# Patient Record
Sex: Female | Born: 1955 | Race: White | Hispanic: No | Marital: Married | Smoking: Current every day smoker
Health system: Southern US, Community
[De-identification: ages and names within clinical notes are randomized; demographics above are authoritative.]

## PROBLEM LIST (undated history)

## (undated) DIAGNOSIS — C159 Malignant neoplasm of esophagus, unspecified: Secondary | ICD-10-CM

## (undated) DIAGNOSIS — E785 Hyperlipidemia, unspecified: Secondary | ICD-10-CM

## (undated) DIAGNOSIS — R531 Weakness: Secondary | ICD-10-CM

## (undated) DIAGNOSIS — E039 Hypothyroidism, unspecified: Secondary | ICD-10-CM

## (undated) DIAGNOSIS — Z720 Tobacco use: Secondary | ICD-10-CM

## (undated) HISTORY — DX: Hyperlipidemia, unspecified: E78.5

## (undated) HISTORY — PX: MANDIBLE SURGERY: SHX707

## (undated) HISTORY — DX: Weakness: R53.1

## (undated) HISTORY — DX: Malignant neoplasm of esophagus, unspecified: C15.9

## (undated) HISTORY — PX: DILATION AND CURETTAGE OF UTERUS: SHX78

---

## 1984-12-10 HISTORY — PX: CHOLECYSTECTOMY: SHX55

## 1998-12-10 HISTORY — PX: ABDOMINAL HYSTERECTOMY: SHX81

## 2001-01-23 ENCOUNTER — Other Ambulatory Visit: Admission: RE | Admit: 2001-01-23 | Discharge: 2001-01-23 | Payer: Self-pay | Admitting: *Deleted

## 2001-02-07 ENCOUNTER — Encounter (INDEPENDENT_AMBULATORY_CARE_PROVIDER_SITE_OTHER): Payer: Self-pay | Admitting: Specialist

## 2001-02-07 ENCOUNTER — Other Ambulatory Visit: Admission: RE | Admit: 2001-02-07 | Discharge: 2001-02-07 | Payer: Self-pay | Admitting: *Deleted

## 2002-06-02 ENCOUNTER — Encounter: Payer: Self-pay | Admitting: Family Medicine

## 2002-06-02 ENCOUNTER — Ambulatory Visit (HOSPITAL_COMMUNITY): Admission: RE | Admit: 2002-06-02 | Discharge: 2002-06-02 | Payer: Self-pay | Admitting: Family Medicine

## 2002-07-14 ENCOUNTER — Other Ambulatory Visit: Admission: RE | Admit: 2002-07-14 | Discharge: 2002-07-14 | Payer: Self-pay | Admitting: *Deleted

## 2002-07-16 ENCOUNTER — Encounter: Payer: Self-pay | Admitting: *Deleted

## 2002-07-16 ENCOUNTER — Ambulatory Visit (HOSPITAL_COMMUNITY): Admission: RE | Admit: 2002-07-16 | Discharge: 2002-07-16 | Payer: Self-pay | Admitting: *Deleted

## 2002-07-29 ENCOUNTER — Encounter: Payer: Self-pay | Admitting: *Deleted

## 2002-07-29 ENCOUNTER — Ambulatory Visit (HOSPITAL_COMMUNITY): Admission: RE | Admit: 2002-07-29 | Discharge: 2002-07-29 | Payer: Self-pay | Admitting: *Deleted

## 2003-01-29 ENCOUNTER — Ambulatory Visit (HOSPITAL_COMMUNITY): Admission: RE | Admit: 2003-01-29 | Discharge: 2003-01-29 | Payer: Self-pay | Admitting: *Deleted

## 2003-01-29 ENCOUNTER — Encounter: Payer: Self-pay | Admitting: *Deleted

## 2003-07-12 ENCOUNTER — Other Ambulatory Visit: Admission: RE | Admit: 2003-07-12 | Discharge: 2003-07-12 | Payer: Self-pay | Admitting: *Deleted

## 2003-08-03 ENCOUNTER — Ambulatory Visit (HOSPITAL_COMMUNITY): Admission: RE | Admit: 2003-08-03 | Discharge: 2003-08-03 | Payer: Self-pay | Admitting: *Deleted

## 2003-08-03 ENCOUNTER — Encounter: Payer: Self-pay | Admitting: *Deleted

## 2004-02-09 ENCOUNTER — Ambulatory Visit (HOSPITAL_COMMUNITY): Admission: RE | Admit: 2004-02-09 | Discharge: 2004-02-09 | Payer: Self-pay | Admitting: *Deleted

## 2004-07-31 ENCOUNTER — Other Ambulatory Visit: Admission: RE | Admit: 2004-07-31 | Discharge: 2004-07-31 | Payer: Self-pay | Admitting: *Deleted

## 2005-08-22 ENCOUNTER — Other Ambulatory Visit: Admission: RE | Admit: 2005-08-22 | Discharge: 2005-08-22 | Payer: Self-pay | Admitting: *Deleted

## 2005-09-12 ENCOUNTER — Ambulatory Visit (HOSPITAL_COMMUNITY): Admission: RE | Admit: 2005-09-12 | Discharge: 2005-09-12 | Payer: Self-pay | Admitting: *Deleted

## 2006-10-16 ENCOUNTER — Other Ambulatory Visit: Admission: RE | Admit: 2006-10-16 | Discharge: 2006-10-16 | Payer: Self-pay | Admitting: *Deleted

## 2006-10-18 ENCOUNTER — Ambulatory Visit (HOSPITAL_COMMUNITY): Admission: RE | Admit: 2006-10-18 | Discharge: 2006-10-18 | Payer: Self-pay | Admitting: *Deleted

## 2007-10-21 ENCOUNTER — Other Ambulatory Visit: Admission: RE | Admit: 2007-10-21 | Discharge: 2007-10-21 | Payer: Self-pay | Admitting: *Deleted

## 2007-10-23 ENCOUNTER — Ambulatory Visit (HOSPITAL_COMMUNITY): Admission: RE | Admit: 2007-10-23 | Discharge: 2007-10-23 | Payer: Self-pay | Admitting: *Deleted

## 2007-11-05 ENCOUNTER — Ambulatory Visit (HOSPITAL_COMMUNITY): Admission: RE | Admit: 2007-11-05 | Discharge: 2007-11-05 | Payer: Self-pay | Admitting: *Deleted

## 2008-05-10 ENCOUNTER — Ambulatory Visit (HOSPITAL_COMMUNITY): Admission: RE | Admit: 2008-05-10 | Discharge: 2008-05-10 | Payer: Self-pay | Admitting: *Deleted

## 2008-06-22 ENCOUNTER — Ambulatory Visit (HOSPITAL_COMMUNITY): Admission: RE | Admit: 2008-06-22 | Discharge: 2008-06-22 | Payer: Self-pay | Admitting: Family Medicine

## 2008-06-23 ENCOUNTER — Ambulatory Visit (HOSPITAL_COMMUNITY): Admission: RE | Admit: 2008-06-23 | Discharge: 2008-06-23 | Payer: Self-pay | Admitting: Family Medicine

## 2008-06-25 ENCOUNTER — Observation Stay (HOSPITAL_COMMUNITY): Admission: RE | Admit: 2008-06-25 | Discharge: 2008-06-26 | Payer: Self-pay | Admitting: Family Medicine

## 2008-07-07 ENCOUNTER — Ambulatory Visit (HOSPITAL_COMMUNITY): Admission: RE | Admit: 2008-07-07 | Discharge: 2008-07-07 | Payer: Self-pay | Admitting: Family Medicine

## 2008-07-07 IMAGING — US US SOFT TISSUE HEAD/NECK
1 series · 14 of 25 positions shown · non-contrast
Comparison: Correlation made with CT chest exam [DATE]

CLINICAL DATA: Abnormal thyroid exam, thyroid abnormality on the CT
chest

THYROID ULTRASOUND
TECHNIQUE: Ultrasound examination of the thyroid gland and all
adjacent soft tissues was performed.

[Series 1: unknown · 0.09mm/px · 14 of 53 slices shown]
[im 1/53]
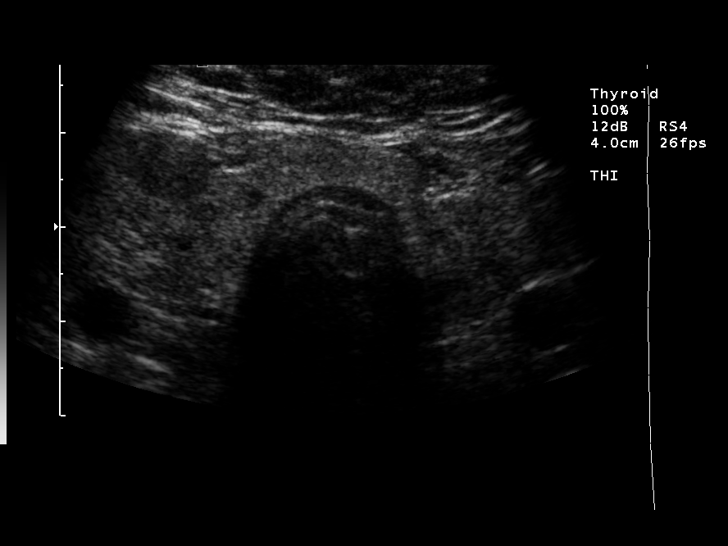
[im 5/53]
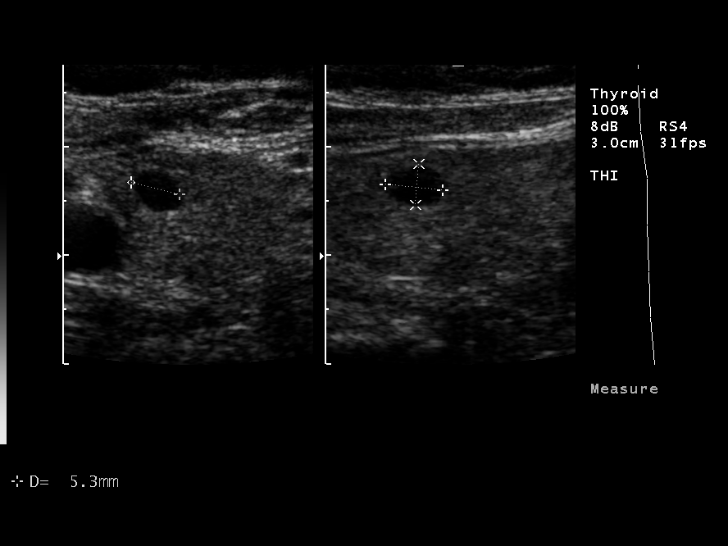
[im 9/53]
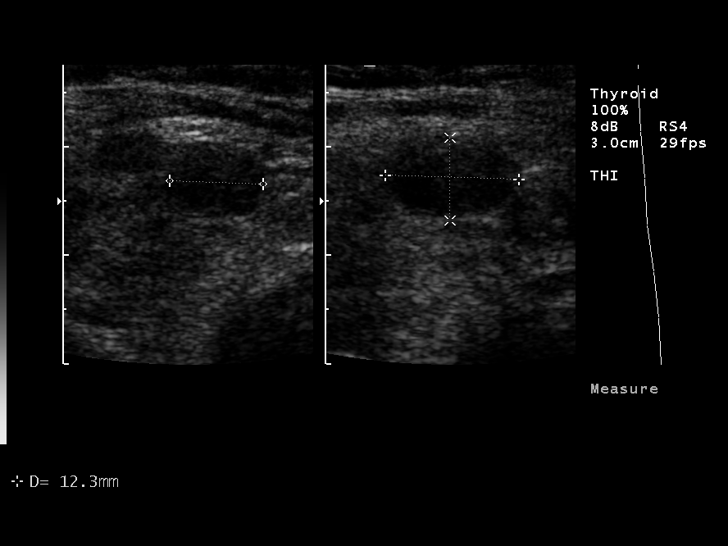
[im 14/53]
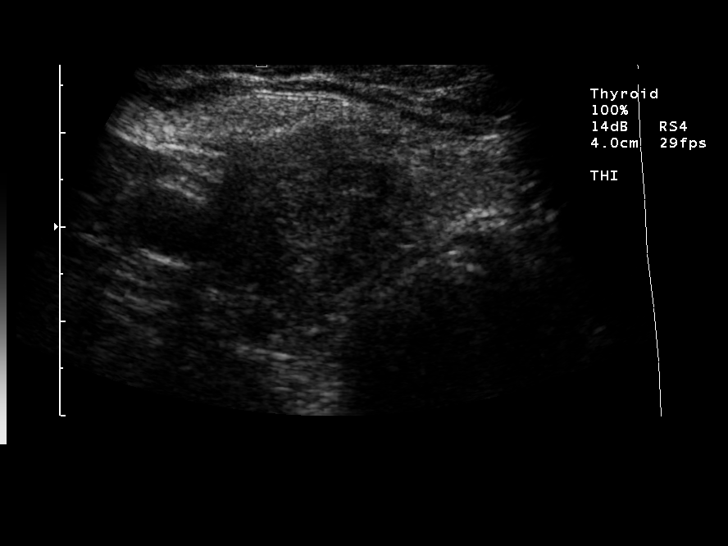
[im 18/53]
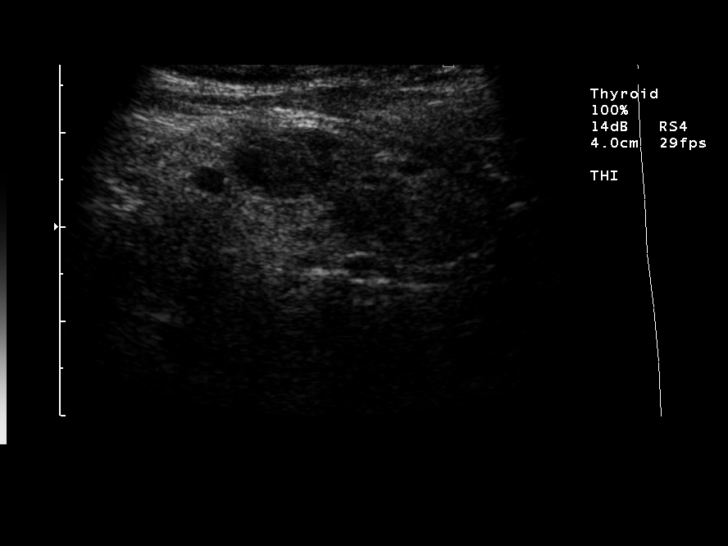
[im 20/53]
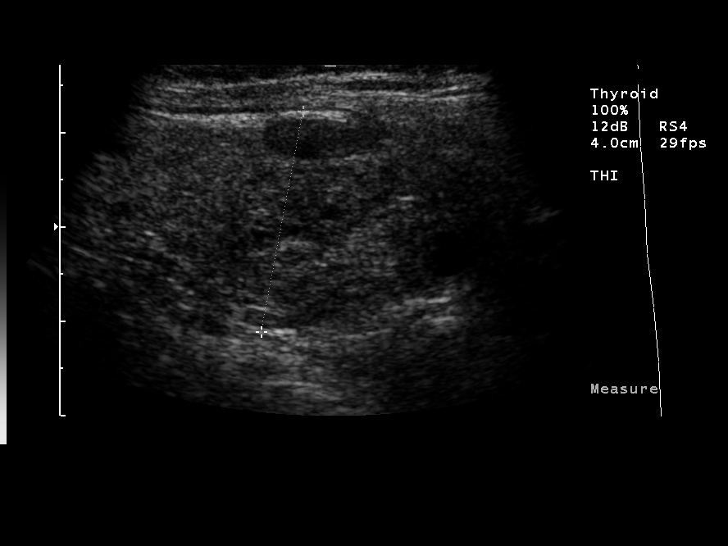
[im 24/53]
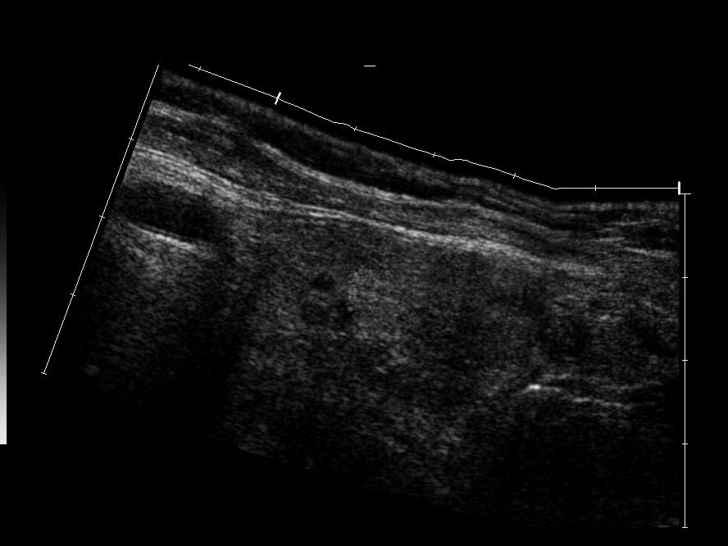
[im 29/53]
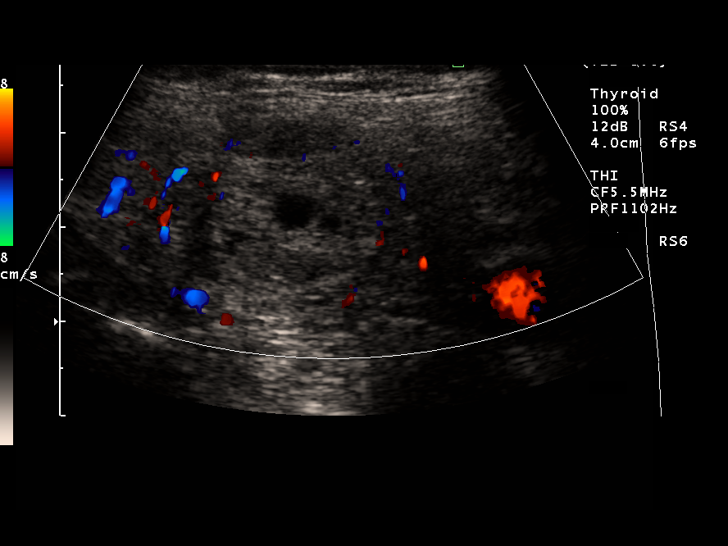
[im 33/53]
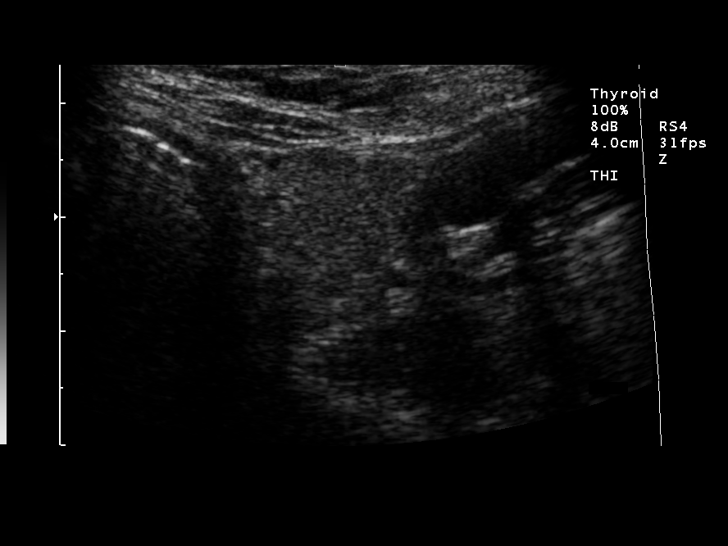
[im 35/53]
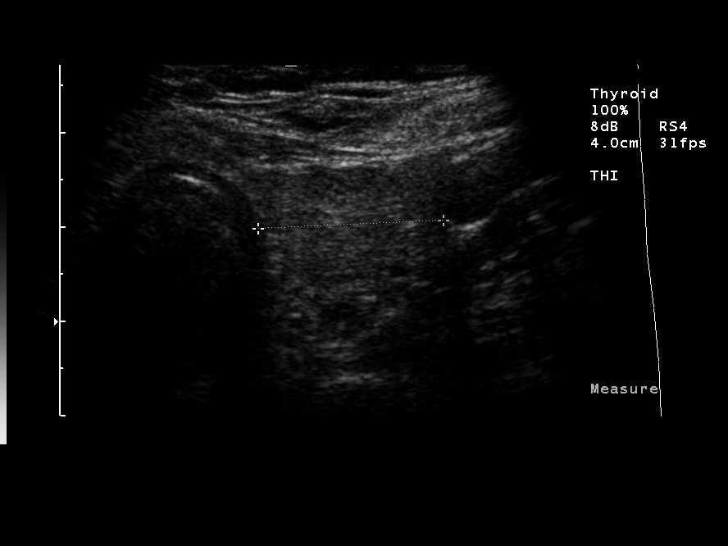
[im 40/53]
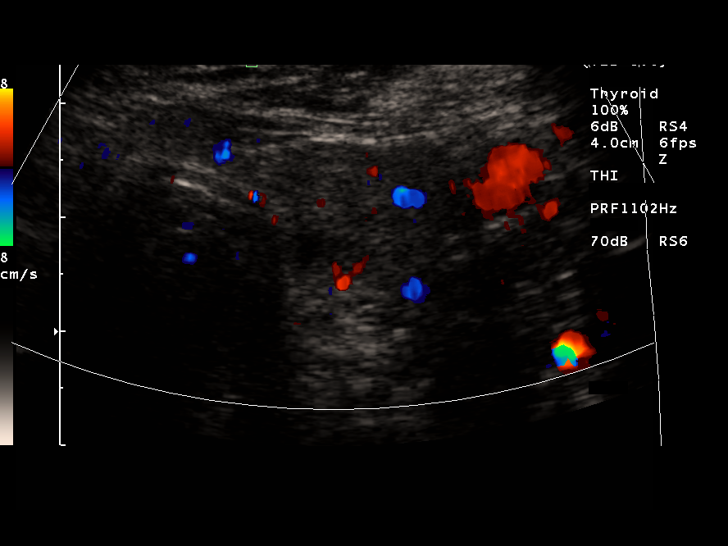
[im 44/53]
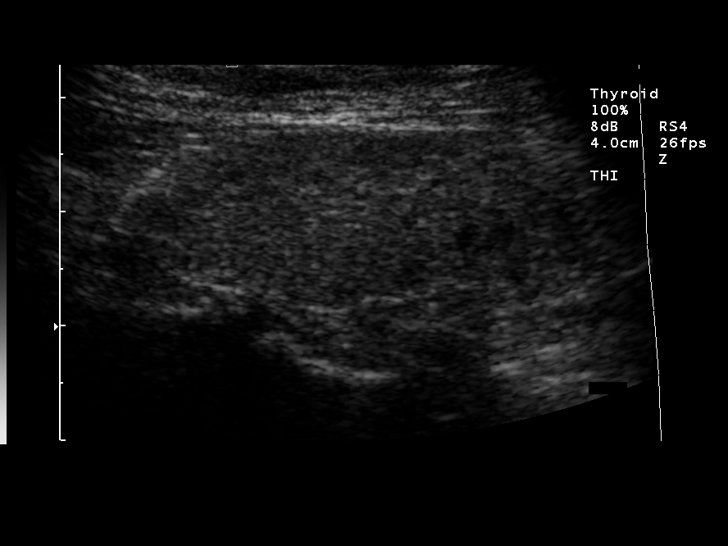
[im 48/53]
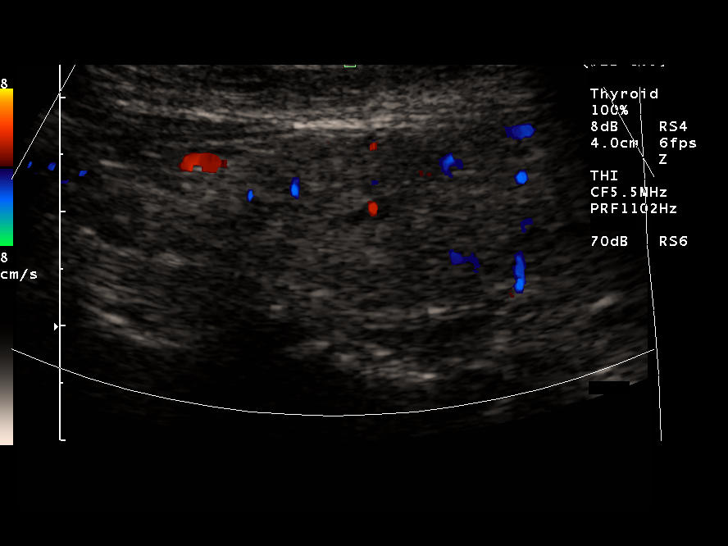
[im 53/53]
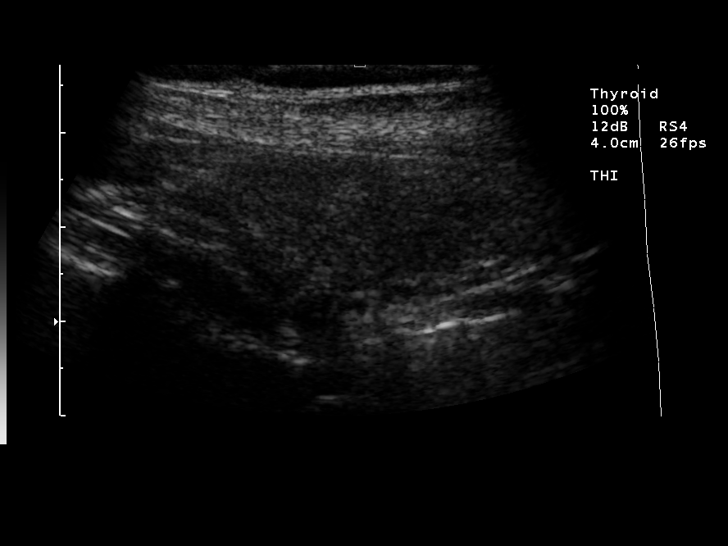

[14 of 25 positions shown; findings below may reference images not displayed]

FINDINGS: Right thyroid lobe 5.7 cm length by 2.4 cm AP by 3.1 cm transverse.
Left thyroid lobe 5.2 cm length by 1.8 cm AP by 2.0 cm transverse.
Thyroid isthmus 4 mm thick.
Diffusely inhomogeneous thyroid echogenicity.
Multiple bilateral thyroid nodules compatible with multinodular
thyroid gland.
Several small cyst noted in the right thyroid lobe.
Mildly hypoechoic nodule inferior left thyroid lobe 11 x 7 x 7 mm.
Heterogeneous nodule mid to inferior right thyroid lobe,
predominately isoechoic with central cystic focus, 2.2 x 1.0 x
cm.
No thyroid calcifications.
No regional adenopathy noted.
IMPRESSION: Multinodular thyroid gland with dominant nodule mid to inferior
right thyroid lobe, cannot exclude malignancy at this lesion,
recommend tissue diagnosis.
Lesion is amendable to ultrasound-guided fine needle aspiration.

## 2008-08-17 ENCOUNTER — Ambulatory Visit (HOSPITAL_COMMUNITY): Admission: RE | Admit: 2008-08-17 | Discharge: 2008-08-17 | Payer: Self-pay | Admitting: General Surgery

## 2008-08-17 ENCOUNTER — Encounter (INDEPENDENT_AMBULATORY_CARE_PROVIDER_SITE_OTHER): Payer: Self-pay | Admitting: General Surgery

## 2008-11-02 ENCOUNTER — Ambulatory Visit (HOSPITAL_COMMUNITY): Admission: RE | Admit: 2008-11-02 | Discharge: 2008-11-02 | Payer: Self-pay | Admitting: Family Medicine

## 2010-05-25 ENCOUNTER — Ambulatory Visit (HOSPITAL_COMMUNITY): Admission: RE | Admit: 2010-05-25 | Discharge: 2010-05-25 | Payer: Self-pay | Admitting: Internal Medicine

## 2010-12-31 ENCOUNTER — Encounter: Payer: Self-pay | Admitting: Family Medicine

## 2011-01-01 ENCOUNTER — Encounter: Payer: Self-pay | Admitting: Family Medicine

## 2011-04-19 ENCOUNTER — Observation Stay (HOSPITAL_COMMUNITY)
Admission: EM | Admit: 2011-04-19 | Discharge: 2011-04-20 | Disposition: A | Discharge status: Home / Self Care | Payer: 59 | Attending: Otolaryngology | Admitting: Otolaryngology

## 2011-04-19 ENCOUNTER — Emergency Department (HOSPITAL_COMMUNITY): Payer: 59

## 2011-04-19 DIAGNOSIS — R0789 Other chest pain: Principal | ICD-10-CM | POA: Insufficient documentation

## 2011-04-19 DIAGNOSIS — F17200 Nicotine dependence, unspecified, uncomplicated: Secondary | ICD-10-CM | POA: Insufficient documentation

## 2011-04-19 DIAGNOSIS — R0602 Shortness of breath: Secondary | ICD-10-CM | POA: Insufficient documentation

## 2011-04-19 DIAGNOSIS — E7800 Pure hypercholesterolemia, unspecified: Secondary | ICD-10-CM | POA: Insufficient documentation

## 2011-04-19 DIAGNOSIS — R110 Nausea: Secondary | ICD-10-CM | POA: Insufficient documentation

## 2011-04-19 LAB — CBC
HCT: 49.7 % — ABNORMAL HIGH (ref 36.0–46.0)
Hemoglobin: 16.2 g/dL — ABNORMAL HIGH (ref 12.0–15.0)
MCH: 34 pg (ref 26.0–34.0)
MCHC: 32.6 g/dL (ref 30.0–36.0)
MCV: 104.4 fL — ABNORMAL HIGH (ref 78.0–100.0)
Platelets: 240 10*3/uL (ref 150–400)
RBC: 4.76 MIL/uL (ref 3.87–5.11)
RDW: 14.7 % (ref 11.5–15.5)
WBC: 7.4 10*3/uL (ref 4.0–10.5)

## 2011-04-19 LAB — DIFFERENTIAL
Basophils Absolute: 0 10*3/uL (ref 0.0–0.1)
Basophils Relative: 0 % (ref 0–1)
Eosinophils Absolute: 0.2 10*3/uL (ref 0.0–0.7)
Eosinophils Relative: 2 % (ref 0–5)
Lymphocytes Relative: 22 % (ref 12–46)
Lymphs Abs: 1.6 10*3/uL (ref 0.7–4.0)
Monocytes Absolute: 0.5 10*3/uL (ref 0.1–1.0)
Monocytes Relative: 7 % (ref 3–12)
Neutro Abs: 5.1 10*3/uL (ref 1.7–7.7)
Neutrophils Relative %: 69 % (ref 43–77)

## 2011-04-19 LAB — BASIC METABOLIC PANEL
BUN: 10 mg/dL (ref 6–23)
CO2: 31 mEq/L (ref 19–32)
Calcium: 10.3 mg/dL (ref 8.4–10.5)
Chloride: 102 mEq/L (ref 96–112)
Creatinine, Ser: 0.77 mg/dL (ref 0.4–1.2)
GFR calc Af Amer: >60 mL/min (ref >60)
GFR calc non Af Amer: >60 mL/min (ref >60)
Glucose, Bld: 126 mg/dL — ABNORMAL HIGH (ref 70–99)
Potassium: 4.2 mEq/L (ref 3.5–5.1)
Sodium: 140 mEq/L (ref 135–145)

## 2011-04-19 LAB — POCT CARDIAC MARKERS
CKMB, poc: 1.5 ng/mL (ref 1.0–8.0)
Myoglobin, poc: 141 ng/mL (ref 12–200)
Troponin i, poc: <0.05 ng/mL (ref 0.00–0.09)

## 2011-04-19 LAB — D-DIMER, QUANTITATIVE: D-Dimer, Quant: 0.23 ug/mL-FEU (ref 0.00–0.48)

## 2011-04-20 DIAGNOSIS — I517 Cardiomegaly: Secondary | ICD-10-CM

## 2011-04-20 LAB — CBC
MCV: 105.6 fL — ABNORMAL HIGH (ref 78.0–100.0)
Platelets: 219 10*3/uL (ref 150–400)
RDW: 14.7 % (ref 11.5–15.5)
WBC: 7.1 10*3/uL (ref 4.0–10.5)

## 2011-04-20 LAB — CARDIAC PANEL(CRET KIN+CKTOT+MB+TROPI)
CK, MB: 2.3 ng/mL (ref 0.3–4.0)
Relative Index: INVALID (ref 0.0–2.5)
Relative Index: INVALID (ref 0.0–2.5)
Relative Index: INVALID (ref 0.0–2.5)
Total CK: 65 U/L (ref 7–177)
Total CK: 62 U/L (ref 7–177)

## 2011-04-20 LAB — DIFFERENTIAL
Basophils Absolute: 0 10*3/uL (ref 0.0–0.1)
Eosinophils Absolute: 0.2 10*3/uL (ref 0.0–0.7)
Eosinophils Relative: 2 % (ref 0–5)

## 2011-04-20 LAB — COMPREHENSIVE METABOLIC PANEL
ALT: 23 U/L (ref 0–35)
Albumin: 2.8 g/dL — ABNORMAL LOW (ref 3.5–5.2)
Alkaline Phosphatase: 90 U/L (ref 39–117)
BUN: 9 mg/dL (ref 6–23)
Chloride: 106 mEq/L (ref 96–112)
Potassium: 4.5 mEq/L (ref 3.5–5.1)
Total Bilirubin: 0.2 mg/dL — ABNORMAL LOW (ref 0.3–1.2)

## 2011-04-20 LAB — LIPID PANEL
Cholesterol: 225 mg/dL — ABNORMAL HIGH (ref 0–200)
LDL Cholesterol: 130 mg/dL — ABNORMAL HIGH (ref 0–99)
Total CHOL/HDL Ratio: 7.3 RATIO

## 2011-04-20 LAB — APTT: aPTT: 35 seconds (ref 24–37)

## 2011-04-24 NOTE — H&P (Signed)
NAME:  Elizabeth Moss, Elizabeth Moss                  ACCOUNT NO.:  [id number]   MEDICAL RECORD NO.:  [id number]          PATIENT TYPE:  AMB   LOCATION:  DAY                           FACILITY:  [facility]   PHYSICIAN:  Dalia Heading, M.D.  DATE OF BIRTH:  March 06, 1956   DATE OF ADMISSION:  DATE OF DISCHARGE:  LH                              HISTORY & PHYSICAL   CHIEF COMPLAINT:  Need for screening colonoscopy.   HISTORY OF PRESENT ILLNESS:  The patient is a 55 year old white female  who is referred for endoscopic evaluation.  She needs a colonoscopy for  screening purposes.  No abdominal pain, weight loss, nausea, vomiting,  diarrhea, constipation, melena, or hematochezia have been noted.  She  has never had a colonoscopy.  There is no family history of colon  carcinoma.   PAST MEDICAL HISTORY:  High cholesterol levels.   PAST SURGICAL HISTORY:  Cholecystectomy, D&C, C-sections, and partial  hysterectomy.   CURRENT MEDICATIONS:  Baby aspirin and simvastatin.   ALLERGIES:  TETRACYCLINE and CONTRAST DYES.   REVIEW OF SYSTEMS:  The patient smokes half pack of cigarettes a day.  She denies any alcohol use.  She denies any other cardiopulmonary  difficulties or bleeding disorders.   PHYSICAL EXAMINATION:  GENERAL:  The patient is a well-developed, well-  nourished white female in no acute distress.  LUNGS:  Clear to auscultation with equal breath sounds bilaterally.  HEART:  Reveals a regular rate and rhythm without S3, S4, or murmurs.  ABDOMEN:  Soft, nontender, and nondistended.  No hepatosplenomegaly or  masses are noted.  RECTAL:  Deferred to the procedure.   IMPRESSION:  Need for screening colonoscopy.   PLAN:  The patient is scheduled for a colonoscopy on August 17, 2008.  The risks and benefits of the procedure including bleeding and  perforation were fully explained to the patient, who gave informed  consent.      Dalia Heading, M.D.  Electronically Signed     MAJ/MEDQ  D:   08/12/2008  T:  08/13/2008  Job:  [id number]   cc:   Corrie Mckusick, M.D.  Fax: [phone]

## 2011-04-24 NOTE — Discharge Summary (Signed)
Elizabeth Moss, Elizabeth Moss                  ACCOUNT NO.:  [id number]   MEDICAL RECORD NO.:  [id number]          PATIENT TYPE:  OBV   LOCATION:  A320                          FACILITY:  [facility]   PHYSICIAN:  Osvaldo Shipper, MD     DATE OF BIRTH:  03/05/1956   DATE OF ADMISSION:  06/25/2008  DATE OF DISCHARGE:  07/18/2009LH                               DISCHARGE SUMMARY   PRIMARY CARE PHYSICIAN:  Corrie Mckusick, M.D.   DISCHARGE DIAGNOSES:  1. Severe allergic reaction, possibly anaphylactoid reaction, to      contrast dye.  2. History of dyslipidemia.  3. Macrocytosis, requiring outpatient evaluation.  4. Hyperglycemia, likely secondary to steroids, given yesterday,      requiring outpatient followup.   HISTORY OF PRESENT ILLNESS:  Please review H and P, dictated yesterday,  for details regarding patient's presenting illness.   BRIEF HOSPITAL COURSE:  Briefly, this is a 55 year old Caucasian female,  who was undergoing CT scan with contrast of her chest to evaluate for  pulmonary nodules, when she became apneic and had to be resuscitated.  Patient apparently had a severe reaction to the contrast dye.  Patient  did not lose her pulse.  She did not require chest compression.  She was  bagged with an Ambu bag.  Patient was treated in the ED with  epinephrine, steroids, Pepcid and Benadryl.  She recovered quickly and  she was monitored overnight in the hospital for any further recurrence.  Patient has done well overnight.  She denied any complaints this morning  and she is considered stable to go back home.  I told her to take over-  the-counter Benadryl as needed, if she experiences anxiety, jitteriness  or shaking.  I have told her, if the symptoms persist or if she develops  chest tightness and shortness of breath, she needs to seek attention  immediately.   She also had hypokalemia, which was corrected.  Telemetry ruled out for  acute coronary syndrome.  She does have slightly elevated  white count,  elevated glucose, which is likely because of the steroids that she was  given in the ED.  These should be monitored as an outpatient.  Her PMD  to follow up on the blood glucose levels.   She also has a macrocytosis, 104.7.  She denies any significant alcohol  use.  She does have family history of hypothyroidism.  She said her  thyroid levels were checked more than three years ago, so I have written  her a prescription for TSH and a free T4.  B12 levels to be considered.   DISCHARGE MEDICATIONS:  1. Aspirin 81 mg daily.  2. Centrum multivitamin daily.  3. Calcium 500 mg daily.  4. Zocor 20 mg daily.  Zocor was started three days ago.  5. Over-the-counter Benadryl, as described above.   FOLLOWUP:  1. Followup with Dr. Phillips Odor next week.  2. TSH and a free T4 to be checked for macrocytosis.  3. Blood glucose level to be checked by the PMD.  Actually, I will  also order that, along with the TSH, so PMD to please follow up on      the results of those tests.   DIET:  Heart-healthy.   PHYSICAL ACTIVITY:  No restrictions.   Total time of this discharge less than 30 minutes.      Osvaldo Shipper, MD  Electronically Signed     GK/MEDQ  D:  06/26/2008  T:  06/26/2008  Job:  [id number]   cc:   Corrie Mckusick, M.D.  Fax: [phone]

## 2011-04-24 NOTE — H&P (Signed)
NAMEALIZ, MERITT                  ACCOUNT NO.:  [id number]   MEDICAL RECORD NO.:  [id number]          PATIENT TYPE:  EMS   LOCATION:  ED                            FACILITY:  [facility]   PHYSICIAN:  Osvaldo Shipper, MD     DATE OF BIRTH:  1956/07/07   DATE OF ADMISSION:  06/25/2008  DATE OF DISCHARGE:  LH                              HISTORY & PHYSICAL   PRIMARY CARE PHYSICIAN:  Corrie Mckusick, M.D.   ADMITTING DIAGNOSIS:  1. Severe reaction to contrast dye, likely anaphylactoid.  2. History of dyslipidemia.   CHIEF COMPLAINT:  Allergic reaction.   HISTORY OF PRESENT ILLNESS:  Patient is a 55 year old Caucasian female,  who has a history of dyslipidemia, who underwent a CT of the chest with  contrast for nodularity, noted on a chest x-ray done a few days ago.  After the CAT scan, patient apparently had a reaction.  She became  apneic, became unresponsive.  She was immediately rushed to the  emergency department from radiology, where she was indeed found to have  a pulse.  Patient was bagged for a few minutes.  She was also  hypotensive.  She was given IV fluids.  She was given epinephrine,  Benadryl and Pepcid.  The patient has responded to this treatment and  currently is complaining just of mild chest pressure in the retrosternal  area.  She denies any shortness of breath, palpitations.  Denies any  fever or chills.  Patient mentions that she has never had contrast dye  before.  She has not been allergic to anything else in the past, apart  from tetracycline.  She was also started on Zocor about two days ago.  She also denies any difficulty swallowing, tongue-swelling or anything  like that.   MEDICATIONS AT HOME:  1. Zocor 20 mg q.p.m.  Has been on it for the past two days.  2. Aspirin 81 mg daily.  3. Multivitamin one tablet daily.  4. Calcium daily.   ALLERGIES:  Include TETRACYCLINE and now CONTRAST DYE.   PAST MEDICAL HISTORY:  Positive for dyslipidemia.  She has had a  cholecystectomy, partial hysterectomy, dilatation and curettages, C-  sections and jaw surgery in the past.   SOCIAL HISTORY:  Lives in [place] with her husband, currently not  working.  Smokes a pack of cigarettes on a daily basis.  Rare alcohol  use.   FAMILY HISTORY:  Positive for hypothyroidism in her mother.  Grandparents have had heart disease, hypertension, stroke and diabetes.   REVIEW OF SYSTEMS:  GENERAL:  Positive for weakness.  HEENT:  Unremarkable.  CARDIOVASCULAR:  As in HPI.  RESPIRATORY:  As in HPI.  GI:  Positive for mild abdominal discomfort.  GU:  Unremarkable.  MUSCULOSKELETAL:  Unremarkable.  NEUROLOGICAL:  Unremarkable.  PSYCHIATRIC:  Unremarkable.  Other systems unremarkable.   PHYSICAL EXAMINATION:  VITAL SIGNS:  Temperature 98.1, blood pressure  initially was 82/49, which has improved to 107/52.  Heart rate in the  90s, regular, respiratory rate 17, saturation 100% on 2 L.  GENERAL EXAM:  Slightly overweight white female, slightly lethargic, but  easily arousable, able to maintain conversation, in no distress.  HEENT:  There is no pallor, no icterus.  All mucous membranes moist.  Pupils are equal, reacting.  No thyromegaly is appreciated.  No  abnormalities in the tympanic membranes.  LUNGS:  Reveal crackles, bilateral bases.  No wheezing is appreciated.  CARDIOVASCULAR:  S1, S2 is normal, regular.  No murmurs appreciated.  ABDOMEN:  Soft.  Big.  Diffuse tenderness is present, but no rebound,  rigidity or guarding is present.  EXTREMITIES:  Show no edema.  Peripheral pulses are palpable.  NEUROLOGIC:  She is alert and oriented times three.  No cranial deficits  are present.  No sensory exam deficits.  No motor deficits at this time.  No stridor is present.   LABORATORY DATA:  No labs have been done.  The CT chest that was done  prior to this episode was negative for any acute or focal pulmonary  abnormality.  Thyroid nodules on the right side were  noted.   An EKG was done, which shows sinus rhythm with a normal axis.  Intervals  appear to be within the normal range.  No acute ST or T-wave changes are  noted.  There are no Q-waves identified.   ASSESSMENT:  This is a 55 year old Caucasian female, who presents after  what sounds like an allergic reaction to contrast dye.  She almost had  an anaphylactic reaction.  She appears to be stable after she received  treatment in the ED.  She needs to be observed in the hospital overnight  because of the severe nature of her reaction.   PLAN:  1. Anaphylactic/allergic reaction to contrast dye:  We will add the      contrast dye allergy to her medical records.  Pepcid will be      continued, but it will be given as needed.  No need for further      doses of steroids unless she develops symptoms again.  We will go      ahead and check some labs on her to make sure there are no other      abnormalities, including cardiac markers.  She did not receive any      chest compressions as a part of the resuscitative efforts.  2. Abnormal lung exam:  She has some crackles.  I will go ahead and      get a portable chest x-ray to evaluate that.  3. History of dyslipidemia:  I will hold off on the Zocor for now and      this can be initiated when she goes back home.  4. DVT prophylaxis will be initiated.  5. Hopefully this will be an overnight observation of this female and      she can probably go home tomorrow morning, if she remains stable.      Osvaldo Shipper, MD  Electronically Signed     GK/MEDQ  D:  06/25/2008  T:  06/25/2008  Job:  [id number]   cc:   Corrie Mckusick, M.D.  Fax: [phone]

## 2011-05-17 NOTE — H&P (Signed)
Elizabeth Moss, Elizabeth Moss                  ACCOUNT NO.:  [id number]  MEDICAL RECORD NO.:  [id number]           PATIENT TYPE:  O  LOCATION:  A306                          FACILITY:  [facility]  PHYSICIAN:  Osvaldo Shipper, MD     DATE OF BIRTH:  [birth year]  DATE OF ADMISSION:  04/19/2011 DATE OF DISCHARGE:  LH                             HISTORY & PHYSICAL   PRIMARY CARE PHYSICIAN:  Corrie Mckusick, MD with [facility].  ADMISSION DIAGNOSES: 1. Chest pain, rule out acute coronary syndrome. 2. History of tobacco abuse. 3. History of hypercholesterolemia. 4. History of goiter, status post recent radioactive iodine use.  CHIEF COMPLAINT:  Chest pain at 3 p.m.  HISTORY OF PRESENT ILLNESS:  The patient is a 55 year old Caucasian female who has a past medical history of hypercholesterolemia who was in her usual state of health at about 3 p.m. this afternoon when she started having chest pain which was located in the retrosternal area, described as a sharp pain radiating to the back in between her shoulder blade and then subsequently to the left arm pertinent to the left arm which also felt numb.  The pain was 6/10 in intensity.  She had shortness of breath associated with some nausea, but denies any dizziness, lightheadedness, syncopal episodes.  Denies any cough.  She was sitting and watching TV when this happened.  She has never had similar pain in the past.  She has never had any kind of cardiac workup in the past.  She came to the ED within half an hour.  She was given nitroglycerin and morphine at the same time and which the patient slowly had abatement in her symptoms.  She tells me that she was at her doctor's office yesterday and was seen by a physician assistant for back pain and was prescribed muscle relaxant anti-inflammatory agent, pain medication and she was also given amoxicillin for ear infection.  She has not taken the anti-inflammatory or the pain medication yet.   She did take a dose of Robaxin which relieved back pain.  Pain in the back was in the lower back, but did radiate to the upper back.  MEDICATIONS AT HOME: 1. Zocor 80 mg daily. 2. Aspirin 81 mg daily. 3. Fish oil, multivitamins and calcium supplement and as mentioned     above. 4. She was prescribed amoxicillin 250 mg b.i.d. for 10 days along with     muscle relaxant anti-inflammatory drugs and other pain medications     the names of which she is not very sure of.  ALLERGIES:  TETRACYCLINE, which causes a rash.  PAST MEDICAL HISTORY:  Positive for jaw surgery requiring metal plates, D and C, C-sections x2, cholecystectomy, hysterectomy.  She has had tonsillectomy in the past.  It looks like she has had a screening colonoscopy with the results of that are not available because they were done under different system.  She was diagnosed with a goiter and underwent radioactive iodine treatment about 2 weeks ago.  SOCIAL HISTORY:  She lives in [place] with her husband is currently unemployed.  Smokes one-pack  of cigarettes on a daily basis.  Has been smoking for 36 years.  Denies any alcohol use or illicit drug use.  FAMILY HISTORY:  She does not know about her father.  Mother has high cholesterol.  She has a half-sister who does not have any medical problems.  REVIEW OF SYSTEMS:  GENERAL:  Positive for weakness, malaise.  HEENT: Unremarkable.  CARDIOVASCULAR:  As in HPI.  RESPIRATORY:  As in HPI. GI:  Unremarkable.  GU:  Unremarkable.  NEUROLOGIC:  Unremarkable. PSYCHIATRIC:  Unremarkable.  HEMATOLOGIC:  Unremarkable.  Other systems reviewed and found to be negative.  PHYSICAL EXAMINATION:  VITAL SIGNS:  Temperature 98.3, blood pressure 171/75, subsequently 123/80, heart rate 80s to 100, regular, respiratory rate 19, saturation 96% on room air. GENERAL:  Slightly overweight white female, in no distress. HEENT:  Head is normocephalic, atraumatic.  Pupils are equal  and reacting.  No pallor.  No icterus.  Oral mucous membranes moist.  No oral lesions are noted. NECK:  Soft and supple.  No thyromegaly appreciated. LUNGS:  Coarse breath sounds bilaterally without any obvious wheezing. No crackles are present. ABDOMEN:  Soft, nontender, nondistended.  Bowel sounds are present.  No masses or organomegaly is appreciated. GU:  Deferred. MUSCULOSKELETAL:  Normal muscle mass and tone. NEUROLOGIC:  She is alert and oriented x3.  No focal neurological deficits are present. SKIN:  Does not reveal any rashes.  LAB DATA:  Her white cell count is normal.  Hemoglobin is 16.2, MCV is 104, platelet count is 240, D-dimer 0.23.  Electrolytes are all normal, glucose 126.  Cardiac enzymes negative x1.  She had an EKG done which shows sinus rhythm at 88, normal axis intervals appear to be in the normal range.  No definite Q-waves.  No concerning ST changes, some nonspecific T-wave changes especially in V1, V2, possibly related to incomplete RBBB is noted.  No older EKGs available in the system.  Other imaging studies done include a chest x-ray which showed minimal bibasilar atelectasis without any other acute findings, mediastinal contours normal.  ASSESSMENT:  This is a 55 year old Caucasian female with a past medical history as stated earlier presents with chest pain which was sharp in nature, radiated to the left arm and the arm became numb.  The pain was located in the retrosternal area.  The pain was relieved with either nitroglycerin or morphine.  We are not sure which.  She had some radiation to the back as well.  However, those symptoms have resolved. Differential diagnosis include acute coronary syndrome, angina.  The D- dimer is normal, so pulmonary embolism is unlikely.  There could be problems with vasculature and gastroesophageal reflux disease could also be another differential.  However, considering relief of her symptoms with nitroglycerin,  morphine, cardiac etiology needs to be ruled out.  PLAN: 1. Chest pain.  We will rule out for acute coronary syndrome with     serial cardiac enzymes.  We will repeat EKG in the morning.  Get an     echocardiogram.  Because of the sharp nature of the pain and     radiation to the back, we did consider CT; however, the patient     apparently had a severe anaphylactic reaction to contrast dye back     in 2009 and she had to be resuscitated.  She became apneic, so     obviously this is not a good idea at this time.  MRA also cannot be     done  because of the metal plate she has been told.  So, we will     proceed with an echocardiogram. 2. Hypercholesterolemia.  Continue with Zocor. 3. History of ear infection.  Continue with amoxicillin. 4. History of goiter with status post recent radioactive iodine     treatment per Dr. Johny Chess her endocrinologist as outpatient. 5. DVT prophylaxis will be initiated.  Further management decisions will depend on results of further testing and patient's response to treatment.  Osvaldo Shipper, MD     GK/MEDQ  D:  04/19/2011  T:  04/19/2011  Job:  [id number]  cc:   Corrie Mckusick, M.D. Fax: [phone]  Electronically Signed by Osvaldo Shipper MD on 05/17/2011 10:12:22 PM

## 2011-08-21 ENCOUNTER — Other Ambulatory Visit (HOSPITAL_COMMUNITY): Payer: Self-pay | Admitting: Family Medicine

## 2011-08-21 ENCOUNTER — Ambulatory Visit (HOSPITAL_COMMUNITY)
Admission: RE | Admit: 2011-08-21 | Discharge: 2011-08-21 | Disposition: A | Discharge status: Home / Self Care | Payer: 59 | Source: Ambulatory Visit | Attending: Family Medicine | Admitting: Family Medicine

## 2011-08-21 DIAGNOSIS — E785 Hyperlipidemia, unspecified: Secondary | ICD-10-CM | POA: Insufficient documentation

## 2011-08-21 DIAGNOSIS — R05 Cough: Secondary | ICD-10-CM | POA: Insufficient documentation

## 2011-08-21 DIAGNOSIS — F17200 Nicotine dependence, unspecified, uncomplicated: Secondary | ICD-10-CM | POA: Insufficient documentation

## 2011-08-21 DIAGNOSIS — E039 Hypothyroidism, unspecified: Secondary | ICD-10-CM

## 2011-08-21 DIAGNOSIS — E559 Vitamin D deficiency, unspecified: Secondary | ICD-10-CM

## 2011-08-21 DIAGNOSIS — R059 Cough, unspecified: Secondary | ICD-10-CM | POA: Insufficient documentation

## 2011-09-07 LAB — CBC
HCT: 43.7
HCT: 51.3 — ABNORMAL HIGH
Hemoglobin: 16.9 — ABNORMAL HIGH
MCV: 104.7 — ABNORMAL HIGH
Platelets: 238
WBC: 12.5 — ABNORMAL HIGH
WBC: 13.6 — ABNORMAL HIGH

## 2011-09-07 LAB — COMPREHENSIVE METABOLIC PANEL
AST: 22
Albumin: 3.1 — ABNORMAL LOW
Alkaline Phosphatase: 93
Chloride: 110
Creatinine, Ser: 0.97
GFR calc Af Amer: >60
Potassium: 2.9 — ABNORMAL LOW
Total Bilirubin: 1.1
Total Protein: 5.9 — ABNORMAL LOW

## 2011-09-07 LAB — DIFFERENTIAL
Basophils Absolute: 0
Eosinophils Absolute: 0
Eosinophils Relative: 0
Eosinophils Relative: 0
Lymphocytes Relative: 19
Lymphs Abs: 0.8
Monocytes Absolute: 0.4
Monocytes Relative: 1 — ABNORMAL LOW
Monocytes Relative: 3

## 2011-09-07 LAB — POCT I-STAT, CHEM 8
BUN: 10
Calcium, Ion: 1.2
Chloride: 104
Creatinine, Ser: 0.9
Glucose, Bld: 123 — ABNORMAL HIGH

## 2011-09-07 LAB — CK TOTAL AND CKMB (NOT AT ARMC)
CK, MB: 3.2
Total CK: 63

## 2011-09-07 LAB — BASIC METABOLIC PANEL
Calcium: 9.1
GFR calc Af Amer: >60
GFR calc non Af Amer: 59 — ABNORMAL LOW
Glucose, Bld: 200 — ABNORMAL HIGH
Potassium: 4.4
Sodium: 140

## 2011-09-07 LAB — CARDIAC PANEL(CRET KIN+CKTOT+MB+TROPI)
CK, MB: 3.9
Relative Index: INVALID
Relative Index: INVALID
Total CK: 81
Total CK: 91
Troponin I: 0.03

## 2011-09-07 LAB — POCT CARDIAC MARKERS
CKMB, poc: <1 — ABNORMAL LOW
Troponin i, poc: <0.05

## 2011-09-12 ENCOUNTER — Other Ambulatory Visit (HOSPITAL_COMMUNITY): Payer: Self-pay | Admitting: Physician Assistant

## 2011-09-12 ENCOUNTER — Ambulatory Visit (HOSPITAL_COMMUNITY)
Admission: RE | Admit: 2011-09-12 | Discharge: 2011-09-12 | Disposition: A | Discharge status: Home / Self Care | Payer: 59 | Source: Ambulatory Visit | Attending: Physician Assistant | Admitting: Physician Assistant

## 2011-09-12 DIAGNOSIS — S63599A Other specified sprain of unspecified wrist, initial encounter: Secondary | ICD-10-CM

## 2011-09-12 DIAGNOSIS — S66819A Strain of other specified muscles, fascia and tendons at wrist and hand level, unspecified hand, initial encounter: Secondary | ICD-10-CM

## 2011-09-12 DIAGNOSIS — R937 Abnormal findings on diagnostic imaging of other parts of musculoskeletal system: Secondary | ICD-10-CM | POA: Insufficient documentation

## 2011-09-12 DIAGNOSIS — M25539 Pain in unspecified wrist: Secondary | ICD-10-CM | POA: Insufficient documentation

## 2011-12-25 ENCOUNTER — Other Ambulatory Visit (HOSPITAL_COMMUNITY): Payer: Self-pay | Admitting: Family Medicine

## 2011-12-25 DIAGNOSIS — R928 Other abnormal and inconclusive findings on diagnostic imaging of breast: Secondary | ICD-10-CM

## 2011-12-25 DIAGNOSIS — Z139 Encounter for screening, unspecified: Secondary | ICD-10-CM

## 2012-01-09 ENCOUNTER — Ambulatory Visit (HOSPITAL_COMMUNITY)
Admission: RE | Admit: 2012-01-09 | Discharge: 2012-01-09 | Disposition: A | Discharge status: Home / Self Care | Payer: 59 | Source: Ambulatory Visit | Attending: Family Medicine | Admitting: Family Medicine

## 2012-01-09 DIAGNOSIS — R928 Other abnormal and inconclusive findings on diagnostic imaging of breast: Secondary | ICD-10-CM | POA: Insufficient documentation

## 2012-09-12 ENCOUNTER — Ambulatory Visit (HOSPITAL_COMMUNITY)
Admission: RE | Admit: 2012-09-12 | Discharge: 2012-09-12 | Disposition: A | Discharge status: Home / Self Care | Payer: 59 | Source: Ambulatory Visit | Attending: Family Medicine | Admitting: Family Medicine

## 2012-09-12 ENCOUNTER — Other Ambulatory Visit (HOSPITAL_COMMUNITY): Payer: Self-pay | Admitting: Family Medicine

## 2012-09-12 DIAGNOSIS — R059 Cough, unspecified: Secondary | ICD-10-CM | POA: Insufficient documentation

## 2012-09-12 DIAGNOSIS — J209 Acute bronchitis, unspecified: Secondary | ICD-10-CM

## 2012-09-12 DIAGNOSIS — R05 Cough: Secondary | ICD-10-CM | POA: Insufficient documentation

## 2012-09-12 DIAGNOSIS — R0989 Other specified symptoms and signs involving the circulatory and respiratory systems: Secondary | ICD-10-CM | POA: Insufficient documentation

## 2012-09-12 DIAGNOSIS — R0789 Other chest pain: Secondary | ICD-10-CM | POA: Insufficient documentation

## 2012-10-10 ENCOUNTER — Encounter: Payer: Self-pay | Admitting: Surgery

## 2012-10-13 ENCOUNTER — Ambulatory Visit (INDEPENDENT_AMBULATORY_CARE_PROVIDER_SITE_OTHER): Payer: 59 | Admitting: Surgery

## 2012-10-13 ENCOUNTER — Encounter: Payer: Self-pay | Admitting: Surgery

## 2012-10-13 VITALS — BP 129/76 | HR 81 | Resp 18 | Ht 64.0 in | Wt 174.0 lb

## 2012-10-13 DIAGNOSIS — I77810 Thoracic aortic ectasia: Secondary | ICD-10-CM | POA: Insufficient documentation

## 2012-10-13 NOTE — Progress Notes (Signed)
[facility] [place]   Patient name: Elizabeth Moss MRN: [id number] DOB: [birth year] Sex: female   Referred by: Dr. Phillips Odor  Reason for referral:  Chief Complaint  Patient presents with  . New Evaluation    slight ectasia of the thoracic aorta/ Dr. Assunta Found     HISTORY OF PRESENT ILLNESS: The patient comes in today for evaluation of thoracic ectasia. This was a finding obtained on a chest x-ray one month ago when she had bronchitis. The patient has a history of hypercholesterolemia and is treated with a statin. She has undergone ablation of her thyroid secondary to a goiter and is on thyroid replacement therapy. She is a current smoker with no intention to quit however she has quit 4 times in the past. She has a family member that has died of a brain aneurysm. She reports no back pain or chest pain.  Past Medical History  Diagnosis Date  . Hyperlipidemia   . Thyroid disease     Past Surgical History  Procedure Date  . Abdominal hysterectomy 2000  . Cholecystectomy 1986  . Mandible surgery     lower and upper    History   Social History  . Marital Status: Married    Spouse Name: N/A    Number of Children: N/A  . Years of Education: N/A   Occupational History  . Not on file.   Social History Main Topics  . Smoking status: Current Every Day Smoker -- 0.5 packs/day for 35 years    Types: Cigarettes  . Smokeless tobacco: Never Used  . Alcohol Use: No  . Drug Use: No  . Sexually Active: Not on file   Other Topics Concern  . Not on file   Social History Narrative  . No narrative on file    Family History  Problem Relation Age of Onset  . Hyperlipidemia Mother   . Other Mother     varicose veins    Allergies as of 10/13/2012 - Review Complete 10/13/2012  Allergen Reaction Noted  . Tetracyclines & related Rash 10/13/2012  . Iohexol  06/25/2008    Current Outpatient Prescriptions on File Prior to Visit  Medication Sig Dispense Refill    . atorvastatin (LIPITOR) 40 MG tablet Take 40 mg by mouth daily.      Marland Kitchen levothyroxine (SYNTHROID, LEVOTHROID) 100 MCG tablet Take 100 mcg by mouth daily.         REVIEW OF SYSTEMS: Cardiovascular: No chest pain, chest pressure, palpitations, orthopnea, or dyspnea on exertion. No claudication or rest pain,  No history of DVT or phlebitis. Pulmonary: No productive cough, asthma or wheezing. Neurologic: No weakness, paresthesias, aphasia, or amaurosis. No dizziness. Hematologic: No bleeding problems or clotting disorders. Musculoskeletal: No joint pain or joint swelling. Gastrointestinal: No blood in stool or hematemesis Genitourinary: No dysuria or hematuria. Psychiatric:: No history of major depression. Integumentary: No rashes or ulcers. Constitutional: No fever or chills.  PHYSICAL EXAMINATION: General: The patient appears their stated age.  Vital signs are BP 129/76  Pulse 81  Resp 18  Ht 5\' 4"  (1.626 m)  Wt 174 lb (78.926 kg)  BMI 29.87 kg/m2  SpO2 98% HEENT:  No gross abnormalities Pulmonary: Respirations are non-labored Abdomen: Soft and non-tender  Musculoskeletal: There are no major deformities.   Neurologic: No focal weakness or paresthesias are detected, Skin: There are no ulcer or rashes noted. Psychiatric: The patient has normal affect. Cardiovascular: There is a regular rate and rhythm without significant  murmur appreciated. No carotid bruits.  Diagnostic Studies: I have reviewed her chest x-ray which shows thoracic aortic ectasia. I have also reviewed a CT angiogram of the chest which was performed in 2009 which shows no evidence of dilatation of the thoracic aorta.  Outside Studies/Documentation Historical records were reviewed.  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 The patient needs to proceed with  another imaging study to evaluate the size of her thoracic aorta. She cannot undergo MRI secondary to metal in her jaw. Therefore, I feel that she needs to have a CT scan of her chest. She cannot have contrast do to a dialyses which caused anaphylaxis in the past. Therefore, I would just order a noncontrasted CT scan of her chest. This be done within the next few weeks and the patient will follow up with me after the study has been completed.     Jorge Ny, M.D. [facility] [place] Office: [phone] Pager:  [phone]

## 2012-10-13 NOTE — Addendum Note (Signed)
Addended by: Sharee Pimple on: 10/13/2012 10:50 AM   Modules accepted: Orders

## 2012-10-31 ENCOUNTER — Encounter: Payer: Self-pay | Admitting: Surgery

## 2012-11-03 ENCOUNTER — Ambulatory Visit (INDEPENDENT_AMBULATORY_CARE_PROVIDER_SITE_OTHER): Payer: 59 | Admitting: Surgery

## 2012-11-03 ENCOUNTER — Encounter: Payer: Self-pay | Admitting: Surgery

## 2012-11-03 ENCOUNTER — Ambulatory Visit
Admission: RE | Admit: 2012-11-03 | Discharge: 2012-11-03 | Disposition: A | Discharge status: Home / Self Care | Payer: 59 | Source: Ambulatory Visit | Attending: Surgery | Admitting: Surgery

## 2012-11-03 ENCOUNTER — Other Ambulatory Visit: Payer: 59

## 2012-11-03 VITALS — BP 130/65 | HR 76 | Resp 16 | Ht 64.0 in | Wt 173.9 lb

## 2012-11-03 DIAGNOSIS — I77810 Thoracic aortic ectasia: Secondary | ICD-10-CM | POA: Insufficient documentation

## 2012-11-03 NOTE — Progress Notes (Signed)
The patient is back today to discuss her CT scan results. Approximately one month ago she was sent for an x-ray to evaluate bronchitis. This suggested thoracic aortic ectasia. I was uncomfortable evaluating this with only an x-ray, and therefore I sent her for a CT scan of the chest. She has an allergy to contrast and so this was a noncontrasted scan. I have reviewed these images. I see no evidence of thoracic aortic ectasia. The study has not been formally read by radiology at this time but I do not feel any worrisome aortic issues will be identified. I discussed these findings with the patient. She will followup with me on a when necessary basis. Total time spent with the patient and reviewing her images was 15 minutes.

## 2014-02-01 ENCOUNTER — Encounter (HOSPITAL_COMMUNITY): Payer: Self-pay | Admitting: Emergency Medicine

## 2014-02-01 ENCOUNTER — Emergency Department (HOSPITAL_COMMUNITY)
Admission: EM | Admit: 2014-02-01 | Discharge: 2014-02-01 | Disposition: A | Discharge status: Home / Self Care | Payer: 59 | Attending: Emergency Medicine | Admitting: Emergency Medicine

## 2014-02-01 ENCOUNTER — Emergency Department (HOSPITAL_COMMUNITY): Payer: 59

## 2014-02-01 DIAGNOSIS — E785 Hyperlipidemia, unspecified: Secondary | ICD-10-CM | POA: Insufficient documentation

## 2014-02-01 DIAGNOSIS — R300 Dysuria: Secondary | ICD-10-CM | POA: Insufficient documentation

## 2014-02-01 DIAGNOSIS — Z9089 Acquired absence of other organs: Secondary | ICD-10-CM | POA: Insufficient documentation

## 2014-02-01 DIAGNOSIS — Z7982 Long term (current) use of aspirin: Secondary | ICD-10-CM | POA: Insufficient documentation

## 2014-02-01 DIAGNOSIS — Z79899 Other long term (current) drug therapy: Secondary | ICD-10-CM | POA: Insufficient documentation

## 2014-02-01 DIAGNOSIS — E079 Disorder of thyroid, unspecified: Secondary | ICD-10-CM | POA: Insufficient documentation

## 2014-02-01 DIAGNOSIS — Z90710 Acquired absence of both cervix and uterus: Secondary | ICD-10-CM | POA: Insufficient documentation

## 2014-02-01 DIAGNOSIS — R339 Retention of urine, unspecified: Secondary | ICD-10-CM | POA: Insufficient documentation

## 2014-02-01 DIAGNOSIS — F17200 Nicotine dependence, unspecified, uncomplicated: Secondary | ICD-10-CM | POA: Insufficient documentation

## 2014-02-01 DIAGNOSIS — K5900 Constipation, unspecified: Secondary | ICD-10-CM | POA: Insufficient documentation

## 2014-02-01 LAB — URINALYSIS, ROUTINE W REFLEX MICROSCOPIC
Bilirubin Urine: NEGATIVE
Glucose, UA: NEGATIVE mg/dL
Hgb urine dipstick: NEGATIVE
Ketones, ur: NEGATIVE mg/dL
LEUKOCYTES UA: NEGATIVE
NITRITE: NEGATIVE
PH: 7 (ref 5.0–8.0)
Protein, ur: NEGATIVE mg/dL
SPECIFIC GRAVITY, URINE: 1.01 (ref 1.005–1.030)
UROBILINOGEN UA: 0.2 mg/dL (ref 0.0–1.0)

## 2014-02-01 MED ORDER — POLYETHYLENE GLYCOL 3350 17 GM/SCOOP PO POWD: 1.0000 | Freq: Once | ORAL | Status: DC

## 2014-02-01 MED ORDER — SODIUM CHLORIDE 0.9 % IV BOLUS (SEPSIS)
1000.0000 mL | Freq: Once | INTRAVENOUS | Status: AC
  Administered 2014-02-01: 1000 mL via INTRAVENOUS

## 2014-02-01 MED ORDER — FENTANYL CITRATE 0.05 MG/ML IJ SOLN
50.0000 ug | Freq: Once | INTRAMUSCULAR | Status: AC
  Administered 2014-02-01: 50 ug via INTRAVENOUS
  Filled 2014-02-01: qty 2

## 2014-02-01 MED ORDER — ONDANSETRON HCL 4 MG/2ML IJ SOLN
4.0000 mg | Freq: Once | INTRAMUSCULAR | Status: AC
  Administered 2014-02-01: 4 mg via INTRAVENOUS
  Filled 2014-02-01: qty 2

## 2014-02-01 MED ORDER — DOCUSATE SODIUM 100 MG PO CAPS: 100.0000 mg | ORAL_CAPSULE | Freq: Two times a day (BID) | ORAL | Status: DC

## 2014-02-01 NOTE — ED Notes (Signed)
Pt states last urinated last around 2100 yesterday. Pt states can't urinate due to constipation.

## 2014-02-01 NOTE — Discharge Instructions (Signed)
Constipation, Adult Constipation is when a person has fewer than 3 bowel movements a week; has difficulty having a bowel movement; or has stools that are dry, hard, or larger than normal. As people grow older, constipation is more common. If you try to fix constipation with medicines that make you have a bowel movement (laxatives), the problem may get worse. Long-term laxative use may cause the muscles of the colon to become weak. A low-fiber diet, not taking in enough fluids, and taking certain medicines may make constipation worse. CAUSES   Certain medicines, such as antidepressants, pain medicine, iron supplements, antacids, and water pills.   Certain diseases, such as diabetes, irritable bowel syndrome (IBS), thyroid disease, or depression.   Not drinking enough water.   Not eating enough fiber-rich foods.   Stress or travel.  Lack of physical activity or exercise.  Not going to the restroom when there is the urge to have a bowel movement.  Ignoring the urge to have a bowel movement.  Using laxatives too much. SYMPTOMS   Having fewer than 3 bowel movements a week.   Straining to have a bowel movement.   Having hard, dry, or larger than normal stools.   Feeling full or bloated.   Pain in the lower abdomen.  Not feeling relief after having a bowel movement. DIAGNOSIS  Your caregiver will take a medical history and perform a physical exam. Further testing may be done for severe constipation. Some tests may include:   A barium enema X-ray to examine your rectum, colon, and sometimes, your small intestine.  A sigmoidoscopy to examine your lower colon.  A colonoscopy to examine your entire colon. TREATMENT  Treatment will depend on the severity of your constipation and what is causing it. Some dietary treatments include drinking more fluids and eating more fiber-rich foods. Lifestyle treatments may include regular exercise. If these diet and lifestyle recommendations  do not help, your caregiver may recommend taking over-the-counter laxative medicines to help you have bowel movements. Prescription medicines may be prescribed if over-the-counter medicines do not work.  HOME CARE INSTRUCTIONS   Increase dietary fiber in your diet, such as fruits, vegetables, whole grains, and beans. Limit high-fat and processed sugars in your diet, such as [place] fries, hamburgers, cookies, candies, and soda.   A fiber supplement may be added to your diet if you cannot get enough fiber from foods.   Drink enough fluids to keep your urine clear or pale yellow.   Exercise regularly or as directed by your caregiver.   Go to the restroom when you have the urge to go. Do not hold it.  Only take medicines as directed by your caregiver. Do not take other medicines for constipation without talking to your caregiver first. [place] IF:   You have bright red blood in your stool.   Your constipation lasts for more than 4 days or gets worse.   You have abdominal or rectal pain.   You have thin, pencil-like stools.  You have unexplained weight loss. MAKE SURE YOU:   Understand these instructions.  Will watch your condition.  Will get help right away if you are not doing well or get worse. Document Released: 08/24/2004 Document Revised: 02/18/2012 Document Reviewed: 09/07/2013 [facility] Patient Information 2014 [place], [place].  Fiber Content in Foods Drinking plenty of fluids and consuming foods high in fiber can help with constipation. See the list below for the fiber content of some common foods. Starches and Grains / Dietary  Fiber (g)  Cheerios, 1 cup / 3 g  Kellogg's Corn Flakes, 1 cup / 0.7 g  Rice Krispies, 1  cup / 0.3 g  Quaker Oat Life Cereal,  cup / 2.1 g  Oatmeal, instant (cooked),  cup / 2 g  Kellogg's Frosted Mini Wheats, 1 cup / 5.1 g  Rice, brown, long-grain (cooked), 1 cup / 3.5 g  Rice, white, long-grain (cooked),  1 cup / 0.6 g  Macaroni, cooked, enriched, 1 cup / 2.5 g Legumes / Dietary Fiber (g)  Beans, baked, canned, plain or vegetarian,  cup / 5.2 g  Beans, kidney, canned,  cup / 6.8 g  Beans, pinto, dried (cooked),  cup / 7.7 g  Beans, pinto, canned,  cup / 5.5 g Breads and Crackers / Dietary Fiber (g)  Graham crackers, plain or honey, 2 squares / 0.7 g  Saltine crackers, 3 squares / 0.3 g  Pretzels, plain, salted, 10 pieces / 1.8 g  Bread, whole-wheat, 1 slice / 1.9 g  Bread, white, 1 slice / 0.7 g  Bread, raisin, 1 slice / 1.2 g  Bagel, plain, 3 oz / 2 g  Tortilla, flour, 1 oz / 0.9 g  Tortilla, corn, 1 small / 1.5 g  Bun, hamburger or hotdog, 1 small / 0.9 g Fruits / Dietary Fiber (g)  Apple, raw with skin, 1 medium / 4.4 g  Applesauce, sweetened,  cup / 1.5 g  Banana,  medium / 1.5 g  Grapes, 10 grapes / 0.4 g  Orange, 1 small / 2.3 g  Raisin, 1.5 oz / 1.6 g  Melon, 1 cup / 1.4 g Vegetables / Dietary Fiber (g)  Green beans, canned,  cup / 1.3 g  Carrots (cooked),  cup / 2.3 g  Broccoli (cooked),  cup / 2.8 g  Peas, frozen (cooked),  cup / 4.4 g  Potatoes, mashed,  cup / 1.6 g  Lettuce, 1 cup / 0.5 g  Corn, canned,  cup / 1.6 g  Tomato,  cup / 1.1 g Document Released: 04/14/2007 Document Revised: 02/18/2012 Document Reviewed: 06/09/2007 [employer] Patient Information 2014 [place], [place].

## 2014-02-01 NOTE — ED Notes (Addendum)
Pt ambulated to restroom & returned to room w/ no complications. Pt advises she was able to urinate at this time but still unable to have a bowel movement.

## 2014-02-01 NOTE — ED Notes (Signed)
Pt alert & oriented x4, stable gait. Patient given discharge instructions, paperwork & prescription(s). Patient  instructed to stop at the registration desk to finish any additional paperwork. Patient verbalized understanding. Pt left department w/ no further questions. 

## 2014-02-02 NOTE — ED Provider Notes (Signed)
CSN: [id number]     Arrival date & time 02/01/14  0202 History   First MD Initiated Contact with Patient 02/01/14 0235     Chief Complaint  Patient presents with  . Constipation  . Urinary Retention     (Consider location/radiation/quality/duration/timing/severity/associated sxs/prior Treatment) HPI History provided by patient. No bowel movement in the last 6 days. Tonight developed inability to urinate and patient became concerned. She has not been trying any stool softeners or fiber at home. She believes that her constipation is attributed to starting new medication Lipitor. She denies any significant abdominal pain - has had some intermittent cramping. No nausea or vomiting. No fevers or chills. Has had previous abdominal surgeries but no history of bowel obstruction. Past Medical History  Diagnosis Date  . Hyperlipidemia   . Thyroid disease    Past Surgical History  Procedure Laterality Date  . Abdominal hysterectomy  2000  . Cholecystectomy  1986  . Mandible surgery      lower and upper  . Cesarean section    . Dilation and curettage of uterus     Family History  Problem Relation Age of Onset  . Hyperlipidemia Mother   . Other Mother     varicose veins   History  Substance Use Topics  . Smoking status: Current Every Day Smoker -- 0.50 packs/day for 35 years    Types: Cigarettes  . Smokeless tobacco: Never Used  . Alcohol Use: No   OB History   Grav Para Term Preterm Abortions TAB SAB Ect Mult Living                 Review of Systems  Constitutional: Negative for fever and chills.  Respiratory: Negative for shortness of breath.   Cardiovascular: Negative for chest pain.  Gastrointestinal: Positive for constipation. Negative for vomiting and abdominal pain.  Genitourinary: Positive for difficulty urinating. Negative for dysuria, hematuria and flank pain.  Musculoskeletal: Negative for back pain, neck pain and neck stiffness.  Skin: Negative for rash.   Neurological: Negative for headaches.  All other systems reviewed and are negative.      Allergies  Tetracyclines & related and Iohexol  Home Medications   Current Outpatient Rx  Name  Route  Sig  Dispense  Refill  . aspirin 81 MG tablet   Oral   Take 81 mg by mouth daily.         Marland Kitchen atorvastatin (LIPITOR) 40 MG tablet   Oral   Take 40 mg by mouth daily.         Marland Kitchen levothyroxine (SYNTHROID, LEVOTHROID) 100 MCG tablet   Oral   Take 88 mcg by mouth daily.          . Omega-3 Fatty Acids (FISH OIL PO)   Oral   Take 1 tablet by mouth daily.         . Cholecalciferol (VITAMIN D PO)   Oral   Take by mouth daily.         Marland Kitchen docusate sodium (COLACE) 100 MG capsule   Oral   Take 1 capsule (100 mg total) by mouth every 12 (twelve) hours.   60 capsule   0   . polyethylene glycol powder (GLYCOLAX/MIRALAX) powder   Oral   Take 255 g (1 Container total) by mouth once.   255 g   0    BP 154/53  Pulse 97  Temp(Src) 97.5 F (36.4 C) (Oral)  Resp 18  Ht 5\' 3"  (1.6 m)  Wt 160 lb (72.576 kg)  BMI 28.35 kg/m2  SpO2 98% Physical Exam  Nursing note and vitals reviewed. Constitutional: She is oriented to person, place, and time. She appears well-developed and well-nourished.  HENT:  Head: Normocephalic and atraumatic.  Mildly dry mucous membranes  Eyes: EOM are normal. Pupils are equal, round, and reactive to light.  Neck: Neck supple.  Cardiovascular: Normal rate, regular rhythm and intact distal pulses.   Pulmonary/Chest: Effort normal and breath sounds normal. No respiratory distress.  Abdominal: Soft. Bowel sounds are normal. She exhibits no distension. There is no tenderness. There is no rebound and no guarding.  No CVA tenderness  Musculoskeletal: Normal range of motion. She exhibits no edema.  Neurological: She is alert and oriented to person, place, and time.  Skin: Skin is warm and dry.    ED Course  Procedures (including critical care time) Labs  Review Labs Reviewed  URINALYSIS, ROUTINE W REFLEX MICROSCOPIC   Imaging Review Dg Abd Acute W/chest  02/01/2014   CLINICAL DATA:  Diffuse abdominal pain; difficulty urinating. Constipation.  EXAM: ACUTE ABDOMEN SERIES (ABDOMEN 2 VIEW & CHEST 1 VIEW)  COMPARISON:  CT CHEST W/O CM dated 11/03/2012; DG CHEST 2 VIEW dated 09/12/2012  FINDINGS: The lungs are well-aerated. Mild bibasilar opacities likely reflect atelectasis. There is no evidence of pleural effusion or pneumothorax. The cardiomediastinal silhouette is within normal limits.  The visualized bowel gas pattern is unremarkable. Scattered stool and air are seen within the colon; there is no evidence of small bowel dilatation to suggest obstruction. No free intra-abdominal air is identified on the provided upright view. Clips are noted within the right upper quadrant, reflecting prior cholecystectomy.  No acute osseous abnormalities are seen; the sacroiliac joints are unremarkable in appearance.  IMPRESSION: 1. Unremarkable bowel gas pattern; no free intra-abdominal air seen. Small amount of stool noted in the colon, without significant radiographic evidence of constipation. 2. Mild bibasilar airspace opacities likely reflect atelectasis.   Electronically Signed   By: Garald Balding M.D.   On: 02/01/2014 04:43   As patient was brought into the emergency department, ambulated to the bathroom and was able to void with good urine output. Urinalysis was obtained and reviewed as above. X-ray obtained and reviewed as above. Patient was given IV fluids. On recheck her abdomen remained soft, nontender nondistended. She is comfortable with plan discharge home with Fleet enema, prescription for Colace and MiraLAX and close outpatient followup. Strict return precautions verbalized is understood.  MDM   Final diagnoses:  Constipation   Evaluated with urinalysis and imaging reviewed as above. No UTI. No back pain or cauda equina. Vital signs and nursing notes  reviewed and considered.     Teressa Lower, MD 02/02/14 [phone]

## 2014-02-26 ENCOUNTER — Ambulatory Visit (HOSPITAL_COMMUNITY)
Admission: RE | Admit: 2014-02-26 | Discharge: 2014-02-26 | Disposition: A | Discharge status: Home / Self Care | Payer: 59 | Source: Ambulatory Visit | Attending: Family Medicine | Admitting: Family Medicine

## 2014-02-26 ENCOUNTER — Other Ambulatory Visit (HOSPITAL_COMMUNITY): Payer: Self-pay | Admitting: Family Medicine

## 2014-02-26 DIAGNOSIS — R1310 Dysphagia, unspecified: Secondary | ICD-10-CM | POA: Diagnosis present

## 2014-02-26 DIAGNOSIS — E43 Unspecified severe protein-calorie malnutrition: Secondary | ICD-10-CM | POA: Diagnosis present

## 2014-02-26 DIAGNOSIS — D7589 Other specified diseases of blood and blood-forming organs: Secondary | ICD-10-CM | POA: Diagnosis present

## 2014-02-26 DIAGNOSIS — C7800 Secondary malignant neoplasm of unspecified lung: Secondary | ICD-10-CM | POA: Diagnosis present

## 2014-02-26 DIAGNOSIS — F17200 Nicotine dependence, unspecified, uncomplicated: Secondary | ICD-10-CM | POA: Diagnosis present

## 2014-02-26 DIAGNOSIS — E785 Hyperlipidemia, unspecified: Secondary | ICD-10-CM | POA: Diagnosis present

## 2014-02-26 DIAGNOSIS — Z9089 Acquired absence of other organs: Secondary | ICD-10-CM

## 2014-02-26 DIAGNOSIS — C7970 Secondary malignant neoplasm of unspecified adrenal gland: Secondary | ICD-10-CM | POA: Diagnosis present

## 2014-02-26 DIAGNOSIS — Z7982 Long term (current) use of aspirin: Secondary | ICD-10-CM

## 2014-02-26 DIAGNOSIS — C155 Malignant neoplasm of lower third of esophagus: Principal | ICD-10-CM | POA: Diagnosis present

## 2014-02-26 DIAGNOSIS — Z6827 Body mass index (BMI) 27.0-27.9, adult: Secondary | ICD-10-CM

## 2014-02-26 DIAGNOSIS — R1011 Right upper quadrant pain: Secondary | ICD-10-CM

## 2014-02-26 DIAGNOSIS — E876 Hypokalemia: Secondary | ICD-10-CM | POA: Diagnosis present

## 2014-02-26 DIAGNOSIS — R112 Nausea with vomiting, unspecified: Secondary | ICD-10-CM | POA: Diagnosis present

## 2014-02-26 DIAGNOSIS — C770 Secondary and unspecified malignant neoplasm of lymph nodes of head, face and neck: Secondary | ICD-10-CM | POA: Diagnosis present

## 2014-02-26 DIAGNOSIS — Z79899 Other long term (current) drug therapy: Secondary | ICD-10-CM

## 2014-02-26 DIAGNOSIS — E039 Hypothyroidism, unspecified: Secondary | ICD-10-CM | POA: Diagnosis present

## 2014-02-26 DIAGNOSIS — J189 Pneumonia, unspecified organism: Secondary | ICD-10-CM | POA: Diagnosis present

## 2014-02-26 DIAGNOSIS — R0902 Hypoxemia: Secondary | ICD-10-CM | POA: Diagnosis present

## 2014-02-26 DIAGNOSIS — Z91041 Radiographic dye allergy status: Secondary | ICD-10-CM

## 2014-02-26 DIAGNOSIS — E873 Alkalosis: Secondary | ICD-10-CM | POA: Diagnosis present

## 2014-02-27 ENCOUNTER — Inpatient Hospital Stay (HOSPITAL_COMMUNITY)
Admission: EM | Admit: 2014-02-27 | Discharge: 2014-03-03 | DRG: 374 | Disposition: A | Discharge status: Home / Self Care | Payer: 59 | Attending: Internal Medicine | Admitting: Internal Medicine

## 2014-02-27 ENCOUNTER — Encounter (HOSPITAL_COMMUNITY): Payer: Self-pay | Admitting: Emergency Medicine

## 2014-02-27 ENCOUNTER — Emergency Department (HOSPITAL_COMMUNITY): Payer: 59

## 2014-02-27 DIAGNOSIS — K228 Other specified diseases of esophagus: Secondary | ICD-10-CM | POA: Diagnosis present

## 2014-02-27 DIAGNOSIS — R1011 Right upper quadrant pain: Secondary | ICD-10-CM | POA: Diagnosis present

## 2014-02-27 DIAGNOSIS — E278 Other specified disorders of adrenal gland: Secondary | ICD-10-CM | POA: Diagnosis present

## 2014-02-27 DIAGNOSIS — C159 Malignant neoplasm of esophagus, unspecified: Secondary | ICD-10-CM

## 2014-02-27 DIAGNOSIS — K229 Disease of esophagus, unspecified: Secondary | ICD-10-CM

## 2014-02-27 DIAGNOSIS — K2289 Other specified disease of esophagus: Secondary | ICD-10-CM | POA: Diagnosis present

## 2014-02-27 DIAGNOSIS — K089 Disorder of teeth and supporting structures, unspecified: Secondary | ICD-10-CM | POA: Diagnosis present

## 2014-02-27 DIAGNOSIS — R109 Unspecified abdominal pain: Secondary | ICD-10-CM

## 2014-02-27 DIAGNOSIS — R1310 Dysphagia, unspecified: Secondary | ICD-10-CM | POA: Diagnosis present

## 2014-02-27 DIAGNOSIS — D7589 Other specified diseases of blood and blood-forming organs: Secondary | ICD-10-CM | POA: Diagnosis present

## 2014-02-27 DIAGNOSIS — E039 Hypothyroidism, unspecified: Secondary | ICD-10-CM | POA: Diagnosis present

## 2014-02-27 DIAGNOSIS — E873 Alkalosis: Secondary | ICD-10-CM | POA: Diagnosis present

## 2014-02-27 DIAGNOSIS — Z720 Tobacco use: Secondary | ICD-10-CM | POA: Diagnosis present

## 2014-02-27 DIAGNOSIS — R112 Nausea with vomiting, unspecified: Secondary | ICD-10-CM | POA: Diagnosis present

## 2014-02-27 DIAGNOSIS — J189 Pneumonia, unspecified organism: Secondary | ICD-10-CM | POA: Diagnosis present

## 2014-02-27 DIAGNOSIS — E43 Unspecified severe protein-calorie malnutrition: Secondary | ICD-10-CM

## 2014-02-27 DIAGNOSIS — E279 Disorder of adrenal gland, unspecified: Secondary | ICD-10-CM

## 2014-02-27 HISTORY — DX: Hypothyroidism, unspecified: E03.9

## 2014-02-27 HISTORY — DX: Tobacco use: Z72.0

## 2014-02-27 LAB — COMPREHENSIVE METABOLIC PANEL
ALK PHOS: 182 U/L — AB (ref 39–117)
ALT: 28 U/L (ref 0–35)
AST: 23 U/L (ref 0–37)
Albumin: 3 g/dL — ABNORMAL LOW (ref 3.5–5.2)
BILIRUBIN TOTAL: 0.9 mg/dL (ref 0.3–1.2)
BUN: 7 mg/dL (ref 6–23)
CHLORIDE: 95 meq/L — AB (ref 96–112)
CO2: 39 mEq/L — ABNORMAL HIGH (ref 19–32)
Calcium: 10.3 mg/dL (ref 8.4–10.5)
Creatinine, Ser: 0.67 mg/dL (ref 0.50–1.10)
GFR calc non Af Amer: >90 mL/min (ref >90)
GLUCOSE: 116 mg/dL — AB (ref 70–99)
POTASSIUM: 3.4 meq/L — AB (ref 3.7–5.3)
SODIUM: 142 meq/L (ref 137–147)
TOTAL PROTEIN: 7.4 g/dL (ref 6.0–8.3)

## 2014-02-27 LAB — CBC WITH DIFFERENTIAL/PLATELET
BASOS PCT: 0 % (ref 0–1)
Basophils Absolute: 0 10*3/uL (ref 0.0–0.1)
EOS PCT: 2 % (ref 0–5)
Eosinophils Absolute: 0.1 10*3/uL (ref 0.0–0.7)
HCT: 49.5 % — ABNORMAL HIGH (ref 36.0–46.0)
HEMOGLOBIN: 16.2 g/dL — AB (ref 12.0–15.0)
LYMPHS PCT: 13 % (ref 12–46)
Lymphs Abs: 0.9 10*3/uL (ref 0.7–4.0)
MCH: 36.1 pg — AB (ref 26.0–34.0)
MCHC: 32.7 g/dL (ref 30.0–36.0)
MCV: 110.2 fL — ABNORMAL HIGH (ref 78.0–100.0)
MONO ABS: 0.5 10*3/uL (ref 0.1–1.0)
MONOS PCT: 8 % (ref 3–12)
NEUTROS PCT: 77 % (ref 43–77)
Neutro Abs: 5.2 10*3/uL (ref 1.7–7.7)
PLATELETS: 195 10*3/uL (ref 150–400)
RBC: 4.49 MIL/uL (ref 3.87–5.11)
RDW: 14.6 % (ref 11.5–15.5)
WBC: 6.7 10*3/uL (ref 4.0–10.5)

## 2014-02-27 LAB — MAGNESIUM: MAGNESIUM: 2.3 mg/dL (ref 1.5–2.5)

## 2014-02-27 LAB — LACTIC ACID, PLASMA: Lactic Acid, Venous: 1.6 mmol/L (ref 0.5–2.2)

## 2014-02-27 MED ORDER — LEVOFLOXACIN IN D5W 750 MG/150ML IV SOLN
750.0000 mg | INTRAVENOUS | Status: DC
  Administered 2014-02-28 – 2014-03-03 (×4): 750 mg via INTRAVENOUS
  Filled 2014-02-27 (×5): qty 150

## 2014-02-27 MED ORDER — LEVOFLOXACIN IN D5W 750 MG/150ML IV SOLN
750.0000 mg | Freq: Once | INTRAVENOUS | Status: AC
  Administered 2014-02-27: 750 mg via INTRAVENOUS
  Filled 2014-02-27: qty 150

## 2014-02-27 MED ORDER — ONDANSETRON HCL 4 MG PO TABS: 4.0000 mg | ORAL_TABLET | Freq: Four times a day (QID) | ORAL | Status: DC | PRN

## 2014-02-27 MED ORDER — ALUM & MAG HYDROXIDE-SIMETH 200-200-20 MG/5ML PO SUSP: 30.0000 mL | Freq: Four times a day (QID) | ORAL | Status: DC | PRN

## 2014-02-27 MED ORDER — POTASSIUM CHLORIDE IN NACL 20-0.9 MEQ/L-% IV SOLN
INTRAVENOUS | Status: DC
  Administered 2014-02-27 – 2014-03-02 (×4): via INTRAVENOUS
  Filled 2014-02-27 (×12): qty 1000

## 2014-02-27 MED ORDER — ALBUTEROL SULFATE (2.5 MG/3ML) 0.083% IN NEBU: 2.5000 mg | INHALATION_SOLUTION | RESPIRATORY_TRACT | Status: DC | PRN

## 2014-02-27 MED ORDER — SODIUM CHLORIDE 0.9 % IV BOLUS (SEPSIS)
500.0000 mL | Freq: Once | INTRAVENOUS | Status: AC
  Administered 2014-02-27: 500 mL via INTRAVENOUS

## 2014-02-27 MED ORDER — MORPHINE SULFATE 4 MG/ML IJ SOLN: 4.0000 mg | INTRAMUSCULAR | Status: DC | PRN

## 2014-02-27 MED ORDER — LEVOTHYROXINE SODIUM 88 MCG PO TABS
88.0000 ug | ORAL_TABLET | Freq: Every day | ORAL | Status: DC
  Administered 2014-02-28 – 2014-03-03 (×3): 88 ug via ORAL
  Filled 2014-02-27 (×6): qty 1

## 2014-02-27 MED ORDER — ACETAMINOPHEN 325 MG PO TABS
650.0000 mg | ORAL_TABLET | Freq: Four times a day (QID) | ORAL | Status: DC | PRN
Start: 2014-02-27 — End: 2014-03-03

## 2014-02-27 MED ORDER — PANTOPRAZOLE SODIUM 40 MG IV SOLR
40.0000 mg | INTRAVENOUS | Status: DC
  Administered 2014-02-28 – 2014-03-03 (×4): 40 mg via INTRAVENOUS
  Filled 2014-02-27 (×5): qty 40

## 2014-02-27 MED ORDER — POTASSIUM CHLORIDE 10 MEQ/100ML IV SOLN
10.0000 meq | INTRAVENOUS | Status: AC
  Administered 2014-02-27 (×2): 10 meq via INTRAVENOUS
  Filled 2014-02-27: qty 100

## 2014-02-27 MED ORDER — ONDANSETRON HCL 4 MG/2ML IJ SOLN
4.0000 mg | Freq: Four times a day (QID) | INTRAMUSCULAR | Status: DC | PRN
  Administered 2014-03-01: 4 mg via INTRAVENOUS
  Filled 2014-02-27: qty 2

## 2014-02-27 MED ORDER — NICOTINE 21 MG/24HR TD PT24
21.0000 mg | MEDICATED_PATCH | Freq: Every day | TRANSDERMAL | Status: DC
  Administered 2014-03-02: 21 mg via TRANSDERMAL
  Filled 2014-02-27 (×5): qty 1

## 2014-02-27 MED ORDER — ACETAMINOPHEN 650 MG RE SUPP: 650.0000 mg | Freq: Four times a day (QID) | RECTAL | Status: DC | PRN

## 2014-02-27 MED ORDER — POTASSIUM CHLORIDE 10 MEQ/100ML IV SOLN: 10.0000 meq | INTRAVENOUS | Status: DC

## 2014-02-27 MED ORDER — PNEUMOCOCCAL VAC POLYVALENT 25 MCG/0.5ML IJ INJ
0.5000 mL | INJECTION | INTRAMUSCULAR | Status: DC
  Filled 2014-02-27 (×3): qty 0.5

## 2014-02-27 MED ORDER — NICOTINE 21 MG/24HR TD PT24: 21.0000 mg | MEDICATED_PATCH | Freq: Every day | TRANSDERMAL | Status: DC

## 2014-02-27 MED ORDER — PANTOPRAZOLE SODIUM 40 MG IV SOLR
40.0000 mg | Freq: Once | INTRAVENOUS | Status: AC
  Administered 2014-02-27: 40 mg via INTRAVENOUS
  Filled 2014-02-27: qty 40

## 2014-02-27 NOTE — ED Notes (Signed)
Report called to Hoyle Sauer, Therapist, sports at [facility]

## 2014-02-27 NOTE — ED Notes (Signed)
Pt resting on stretcher. Awaiting arrival of [facility]

## 2014-02-27 NOTE — ED Provider Notes (Signed)
CSN: [id number]     Arrival date & time 02/27/14  1153 History  This chart was scribed for Maudry Diego, MD by Roxan Diesel, ED scribe.  This patient was seen in room APA12/APA12 and the patient's care was started at 1:33 PM.   Chief Complaint  Patient presents with  . Abdominal Pain    Patient is a 58 y.o. female presenting with abdominal pain. The history is provided by the patient. No language interpreter was used.  Abdominal Pain Pain location:  RUQ Pain radiates to:  R shoulder Pain severity:  Moderate Duration:  3 weeks Progression:  Worsening Chronicity:  New Worsened by:  Palpation Associated symptoms: no chest pain, no cough, no diarrhea, no fatigue and no hematuria     HPI Comments: Elizabeth Moss is a 58 y.o. female who presents to the Emergency Department complaining of progressively-worsening abdominal pain that began 3 weeks ago.  Pt states "it feels like a gallbladder attack but I have no gallbladder."  She localizes pain to RUQ abdomen radiating to her right shoulder.  Pt has been seen by her PCP for the same.  She received a CT-scan yesterday and was advised by her PCP to come here today.  She denies cough.  Pt is a current one-pack-per-day smoker.   Past Medical History  Diagnosis Date  . Hyperlipidemia   . Thyroid disease     Past Surgical History  Procedure Laterality Date  . Abdominal hysterectomy  2000  . Cholecystectomy  1986  . Mandible surgery      lower and upper  . Cesarean section    . Dilation and curettage of uterus      Family History  Problem Relation Age of Onset  . Hyperlipidemia Mother   . Other Mother     varicose veins    History  Substance Use Topics  . Smoking status: Current Every Day Smoker -- 0.50 packs/day for 35 years    Types: Cigarettes  . Smokeless tobacco: Never Used  . Alcohol Use: No    OB History   Grav Para Term Preterm Abortions TAB SAB Ect Mult Living                   Review of Systems   Constitutional: Negative for appetite change and fatigue.  HENT: Negative for congestion, ear discharge and sinus pressure.   Eyes: Negative for discharge.  Respiratory: Negative for cough.   Cardiovascular: Negative for chest pain.  Gastrointestinal: Positive for abdominal pain. Negative for diarrhea.  Genitourinary: Negative for frequency and hematuria.  Musculoskeletal: Negative for back pain.  Skin: Negative for rash.  Neurological: Negative for seizures and headaches.  Psychiatric/Behavioral: Negative for hallucinations.      Allergies  Iohexol and Tetracyclines & related  Home Medications   Current Outpatient Rx  Name  Route  Sig  Dispense  Refill  . levothyroxine (SYNTHROID, LEVOTHROID) 88 MCG tablet   Oral   Take 88 mcg by mouth daily before breakfast.         . aspirin 81 MG tablet   Oral   Take 81 mg by mouth daily.         Marland Moss atorvastatin (LIPITOR) 40 MG tablet   Oral   Take 20 mg by mouth daily.          . Omega-3 Fatty Acids (FISH OIL PO)   Oral   Take 1 capsule by mouth daily.          Marland Moss  ondansetron (ZOFRAN-ODT) 4 MG disintegrating tablet   Oral   Take 1 tablet by mouth as needed. nausea          BP 100/54  Pulse 91  Temp(Src) 98.4 F (36.9 C) (Oral)  Resp 20  Ht 5\' 4"  (1.626 m)  Wt 159 lb (72.122 kg)  BMI 27.28 kg/m2  SpO2 92%  Physical Exam  Nursing note and vitals reviewed. Constitutional: She is oriented to person, place, and time. She appears well-developed.  HENT:  Head: Normocephalic.  Eyes: Conjunctivae and EOM are normal. No scleral icterus.  Neck: Neck supple. No thyromegaly present.  Cardiovascular: Normal rate and regular rhythm.  Exam reveals no gallop and no friction rub.   No murmur heard. Pulmonary/Chest: No stridor. She has no wheezes. She has no rales. She exhibits no tenderness.  Crackles in both bases  Abdominal: She exhibits no distension. There is tenderness (moderate) in the right upper quadrant. There is  no rebound.  Musculoskeletal: Normal range of motion. She exhibits no edema.  Lymphadenopathy:    She has no cervical adenopathy.  Neurological: She is oriented to person, place, and time. She exhibits normal muscle tone. Coordination normal.  Skin: No rash noted. No erythema.  Psychiatric: She has a normal mood and affect. Her behavior is normal.    ED Course  Procedures (including critical care time)  DIAGNOSTIC STUDIES: Oxygen Saturation is 92% on room air, low by my interpretation.    COORDINATION OF CARE: 1:36 PM-Discussed treatment plan which includes review of pt's records with pt at bedside and pt agreed to plan.    Labs Review Labs Reviewed - No data to display  Imaging Review Ct Abdomen Wo Contrast  02/26/2014   CLINICAL DATA:  Right upper quadrant pain  EXAM: CT ABDOMEN WITHOUT CONTRAST  TECHNIQUE: Multidetector CT imaging of the abdomen was performed following the standard protocol without IV contrast.  COMPARISON:  DG ABD ACUTE W/CHEST dated 02/01/2014  FINDINGS: There is a branching linear nodular pattern within the right lower lobe within air bronchograms (image number 5 and 6). This is new from prior.  No focal hepatic lesion on this noncontrast exam. Post cholecystectomy. The pancreas, spleen, and kidneys are unremarkable on this noncontrast exam. There is an exophytic low-density round lesion extending from the left kidney which likely represent benign cysts.  The right adrenal gland is enlarged measuring 17 mm (image 16). This enlargement of appears new from comparison chest CT 11/03/2012 and is new from CT of 06/25/2008.  There is circumferential thickening of the distal esophagus approximately 4 cm above the gastroesophageal junction. Single wall thickness measures 17 mm. 7 mm paraesophageal lymph node (image 5).  The stomach is normal. Limited view of the small bowel is unremarkable. No retroperitoneal adenopathy. Small gastrohepatic ligament lymph nodes measure 10 mm.   IMPRESSION: 1. Thickening of the distal esophagus is concerning for esophageal carcinoma. 2. New enlargement of the right adrenal gland is concerning for metastasis. 3. Linear nodular branching pattern in the right lower lobe is nonspecific and could represent infection or neoplasm. 4. Recommend GI consultation and endoscopy to evaluate esophagus. Patient may benefit from FDG PET scan for further evaluation of these concerning findings. These results will be called to the ordering clinician or representative by the Radiologist Assistant, and communication documented in the PACS Dashboard.   Electronically Signed   By: Suzy Bouchard M.D.   On: 02/26/2014 18:13   Dg Chest 2 View  02/26/2014   CLINICAL DATA:  Right lower chest pain, history of tobacco use.  EXAM: CHEST  2 VIEW  COMPARISON:  DG ABD ACUTE W/CHEST dated 02/01/2014  FINDINGS: There is increased density anteriorly in the right lower lobe consistent with pneumonia. There is subsegmental atelectasis in the lingula. The left lower lobe and both upper lobes exhibit no infiltrates or atelectasis. Stable mildly increased interstitial markings are present in both lungs. The cardiac silhouette is normal in size. The pulmonary vascularity is not engorged. There is mild tortuosity of the descending thoracic aorta. There is no pleural effusion. The observed portions of the bony thorax exhibit no acute abnormalities.  IMPRESSION: 1. There is patchy anterior right lower lobe pneumonia and there is lingular atelectasis on the left. 2. Mildly increased interstitial markings are present bilaterally consistent with the patient's smoking history. 3. There is no evidence of CHF nor pleural effusion.   Electronically Signed   By: David  Martinique   On: 02/26/2014 16:15   Ct Chest Wo Contrast  02/27/2014   CLINICAL DATA:  Chest pain. Bibasilar infiltrates. Severe contrast allergy.  EXAM: CT CHEST WITHOUT CONTRAST  TECHNIQUE: Multidetector CT imaging of the chest was  performed following the standard protocol without IV contrast.  COMPARISON:  11/03/2012  FINDINGS: Fluid-filled upper thoracic esophagus is seen with a masslike density in the lower thoracic esophagus measuring 2.8 x 3.7 cm on image 32. This is suspicious for esophageal carcinoma. New high right paratracheal mediastinal adenopathy is seen measuring 1.7 cm on image 8, consistent with metastatic disease and there also tiny less than 1 cm right supraclavicular lymph nodes, which are new since previous study.  An 11 mm pulmonary nodule is seen in the right middle lobe on image 29 and a 6 mm nodule is seen in central left lower lobe on image 33, highly suspicious for pulmonary metastases. A few other tiny scattered less than 5 mm right lung nodules are seen which are nonspecific on images 11 and 21.  Patchy airspace opacity is seen in the right lung base, suspicious for pneumonia or aspiration. Mild atelectasis or scarring noted in the left lung base. No evidence of pleural effusion.  Small lymph nodes are seen in the gastric patent ligament measuring up to 9 mm, suspicious for early lymph node metastases. A 1.9 cm right adrenal mass is also seen which is new and consistent with adrenal metastasis. No suspicious bone lesions identified  IMPRESSION: Distal esophageal masslike density measuring 3.7 cm, suspicious for primary esophageal carcinoma. Consider endoscopy for further evaluation, as well as PET-CT scan for staging.  Right paratracheal lymphadenopathy, consistent with metastatic disease. Shotty less than 1 cm lymph nodes in the gastrohepatic ligament and right supraclavicular region are also suspicious for early lymph node metastases.  Small bilateral pulmonary metastases, as described above.  Right lower lung airspace disease, suspicious for pneumonia or aspiration. Mild left basilar atelectasis or scarring.  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   Electronically Signed   By: Earle Gell M.D.   On: 02/27/2014 14:50     EKG Interpretation None      MDM   Final diagnoses:  None    The chart was scribed for me under my direct supervision.  I personally performed the history, physical, and medical decision making and all procedures in the evaluation of this patient.Maudry Diego, MD 02/27/14 [phone]

## 2014-02-27 NOTE — Progress Notes (Signed)
[facility] progress note. Chief complaint. Transfer note. History of present illness. This 58 year old female was admitted through [facility] with abdominal pain on the right side and nausea with vomiting. CT scan was obtained and it was concerning for esophageal carcinoma and no enlargement of the right adrenal gland concerning for metastasis. CT of chest revealed distal esophageal mass suspicious for primary esophageal carcinoma. She was felt to require transfer to [facility] for probable GI consult and oncology consult in the a.m. Patient has arrived I'm seeing her at bedside to ensure she remains clinically stable her orders transferred appropriately. Vital signs. Temperature 97.8, pulse 75, respiration 20, blood pressure 94/59. O2 sats 100%. General appearance. Well-developed middle-aged female who is alert and in no distress. She does complain of pleuritic pain under the right breast. Cardiac. Rate and rhythm regular. Lungs. No distress. Abdomen. Soft without pain. Impression/plan. Problem #1. Esophageal mass and possibly adrenal mass. Patient will likely need PET scan, GI consult and oncology consult arranged in a.m. Problem #2. He did require pneumonia. Patient continues on Levaquin antibiotic therapy. Problem #3. Hypokalemia. Patient for IV potassium replacement and serum magnesium levels. Patient appears clinically stable post transfer. All orders appear to have transferred appropriately.

## 2014-02-27 NOTE — ED Notes (Signed)
Report given to [facility] with [facility]. ETA ~10 min

## 2014-02-27 NOTE — ED Notes (Signed)
[facility] here to transport pt to [facility].

## 2014-02-27 NOTE — H&P (Signed)
[facility] History and Physical  Elizabeth Moss CVE:[id number] DOB: Jul 04, 1956 DOA: 02/27/2014  Referring physician: He is, Dr. Roderic Palau PCP: [name], MD   Chief Complaint: Abdominal pain, right side and recent history of nausea vomiting.  HPI: Elizabeth Moss is a 58 y.o. female with a history of hypothyroidism and hyperlipidemia, who presents to the emergency department with a complaint of right upper quadrant abdominal pain. She also reports a three-week history of intermittent nausea and vomiting. She has had progressive abdominal pain for the past 3 weeks. It is located in the right upper quadrant, radiates to her back, and into her right shoulder. It felt like a "gallbladder attack" when she had her gallbladder, but she has had a cholecystectomy. The pain is described as a pleuritic pain which worsens when she breathes in deeply. At its worse, is a 10 over 10 in intensity. She has taken pain medication prescribed by her primary care provider which did help a little. She denies blood in her emesis. She denies black tarry stools or bright red blood per rectum. Her last bowel movement was yesterday and considered normal. She has had a cough, but she attributes this to smoking. She denies bloody sputum. She has had difficulty swallowing solids, but not liquids for the past 3 weeks. She denies pain with swallowing. She has eaten very little over the past few weeks, but she is able to swallow her small pills without difficulty. She has lost approximately 13-15 pounds over the past 30 days. She denies associated fever, chills, and night sweats.  In the emergency department, she is afebrile and hemodynamically stable. Her lab data are significant for hemoglobin of 16.2, potassium of 3.4, CO2 of 39, and glucose 116. CT scan of her abdomen reveals thickening of the distal esophagus, concerning for esophageal carcinoma, new enlargement of the right adrenal gland, concerning for metastasis, et  Ronney Asters. 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 She is being admitted for further evaluation and management.     Review of Systems:  As above in history present illness. Otherwise negative.    Past Medical History  Diagnosis Date  . Hyperlipidemia   . Hypothyroidism   . Tobacco abuse    Past Surgical History  Procedure Laterality Date  . Abdominal hysterectomy  2000  . Cholecystectomy  1986  . Mandible surgery      lower and upper  . Cesarean section    . Dilation and curettage of uterus     Social History: She is married. She has 3 children. She lives in [place]. She is unemployed. She smokes one pack of cigarettes per day and has been doing so for more than 30 years. She denies alcohol and illicit drug use.   Allergies  Allergen Reactions  . Iohexol Anaphylaxis     Desc: PATIENT GOES INTO ANAPHALATIC SHOCK AFTER INJECTION OF OMNIPAQUE, Onset Date: [id number]   . Tetracyclines & Related Rash    Family History  Problem Relation Age of Onset  . Hyperlipidemia Mother   . Other Mother     varicose veins   no known family history of cancer in her parents.   Prior to Admission medications   Medication Sig Start Date End Date Taking? Authorizing Provider  levothyroxine (SYNTHROID, LEVOTHROID) 88 MCG tablet Take 88 mcg by mouth daily before breakfast.   Yes Historical Provider, MD  aspirin 81 MG tablet Take 81 mg by mouth daily.    Historical Provider, MD  atorvastatin (LIPITOR) 40 MG tablet Take 20 mg by mouth daily.     Historical Provider, MD  Omega-3 Fatty Acids (FISH OIL PO) Take 1 capsule by mouth daily.     Historical Provider, MD  ondansetron  (ZOFRAN-ODT) 4 MG disintegrating tablet Take 1 tablet by mouth as needed. nausea 02/09/14   Historical Provider, MD   Physical Exam: Filed Vitals:   02/27/14 1752  BP: 103/67  Pulse: 81  Temp: 98.1 F (36.7 C)  Resp: 18    BP 103/67  Pulse 81  Temp(Src) 98.1 F (36.7 C) (Oral)  Resp 18  Ht 5\' 4"  (1.626 m)  Wt 72.122 kg (159 lb)  BMI 27.28 kg/m2  SpO2 95%  General:  Appears calm and comfortable; mildly disheveled appearance Eyes: PERRL, normal lids, irises & conjunctiva ENT: Oropharynx with dry mucous membranes. Multiple cavities; poor dentition; no posterior exudates or erythema. Normal hearing. Neck: No palpation of gross adenopathy or thyromegaly. Neck is supple. Cardiovascular: RRR, no m/r/g. No LE edema. Telemetry:  Respiratory: CTA bilaterally, no w/r/r. Normal respiratory effort. Abdomen: soft, positive bowel sounds, soft, mildly tender in the right upper quadrant/flank area. No rebound, guarding, or masses palpated. No hepatosplenomegaly. Skin: no rash or induration seen on limited exam Musculoskeletal: grossly normal tone BUE/BLE Psychiatric: grossly normal mood and affect, speech fluent and appropriate Neurologic: grossly non-focal.          Labs on Admission:  Basic Metabolic Panel:  Recent Labs Lab 02/27/14 1349  NA 142  K 3.4*  CL 95*  CO2 39*  GLUCOSE 116*  BUN 7  CREATININE 0.67  CALCIUM 10.3  MG 2.3   Liver Function Tests:  Recent Labs Lab 02/27/14 1349  AST 23  ALT 28  ALKPHOS 182*  BILITOT 0.9  PROT 7.4  ALBUMIN 3.0*   No results found for this basename: LIPASE, AMYLASE,  in the last 168 hours No results found for this basename: AMMONIA,  in the last 168 hours CBC:  Recent Labs Lab 02/27/14 1349  WBC 6.7  NEUTROABS 5.2  HGB 16.2*  HCT 49.5*  MCV 110.2*  PLT 195   Cardiac Enzymes: No results found for this basename: CKTOTAL, CKMB, CKMBINDEX, TROPONINI,  in the last 168 hours  BNP (last 3 results) No results found for  this basename: PROBNP,  in the last 8760 hours CBG: No results found for this basename: GLUCAP,  in the last 168 hours  Radiological Exams on Admission: Ct Abdomen Wo Contrast  02/26/2014   CLINICAL DATA:  Right upper quadrant pain  EXAM: CT ABDOMEN WITHOUT CONTRAST  TECHNIQUE: Multidetector CT imaging of the abdomen was performed following the standard protocol without IV contrast.  COMPARISON:  DG ABD ACUTE W/CHEST dated 02/01/2014  FINDINGS: There is a branching linear nodular pattern within the right lower lobe within air bronchograms (image number 5 and 6). This is new from prior.  No focal hepatic lesion on this noncontrast exam. Post cholecystectomy. The pancreas, spleen, and kidneys are unremarkable on this noncontrast exam. There is an exophytic low-density round lesion extending from the left kidney which likely represent benign cysts.  The right adrenal gland is enlarged measuring 17 mm (image 16). This enlargement of appears new from comparison chest CT 11/03/2012 and is new from  CT of 06/25/2008.  There is circumferential thickening of the distal esophagus approximately 4 cm above the gastroesophageal junction. Single wall thickness measures 17 mm. 7 mm paraesophageal lymph node (image 5).  The stomach is normal. Limited view of the small bowel is unremarkable. No retroperitoneal adenopathy. Small gastrohepatic ligament lymph nodes measure 10 mm.  IMPRESSION: 1. Thickening of the distal esophagus is concerning for esophageal carcinoma. 2. New enlargement of the right adrenal gland is concerning for metastasis. 3. Linear nodular branching pattern in the right lower lobe is nonspecific and could represent infection or neoplasm. 4. Recommend GI consultation and endoscopy to evaluate esophagus. Patient may benefit from FDG PET scan for further evaluation of these concerning findings. These results will be called to the ordering clinician or representative by the Radiologist Assistant, and communication  documented in the PACS Dashboard.   Electronically Signed   By: Suzy Bouchard M.D.   On: 02/26/2014 18:13   Dg Chest 2 View  02/26/2014   CLINICAL DATA:  Right lower chest pain, history of tobacco use.  EXAM: CHEST  2 VIEW  COMPARISON:  DG ABD ACUTE W/CHEST dated 02/01/2014  FINDINGS: There is increased density anteriorly in the right lower lobe consistent with pneumonia. There is subsegmental atelectasis in the lingula. The left lower lobe and both upper lobes exhibit no infiltrates or atelectasis. Stable mildly increased interstitial markings are present in both lungs. The cardiac silhouette is normal in size. The pulmonary vascularity is not engorged. There is mild tortuosity of the descending thoracic aorta. There is no pleural effusion. The observed portions of the bony thorax exhibit no acute abnormalities.  IMPRESSION: 1. There is patchy anterior right lower lobe pneumonia and there is lingular atelectasis on the left. 2. Mildly increased interstitial markings are present bilaterally consistent with the patient's smoking history. 3. There is no evidence of CHF nor pleural effusion.   Electronically Signed   By: David  Martinique   On: 02/26/2014 16:15   Ct Chest Wo Contrast  02/27/2014   CLINICAL DATA:  Chest pain. Bibasilar infiltrates. Severe contrast allergy.  EXAM: CT CHEST WITHOUT CONTRAST  TECHNIQUE: Multidetector CT imaging of the chest was performed following the standard protocol without IV contrast.  COMPARISON:  11/03/2012  FINDINGS: Fluid-filled upper thoracic esophagus is seen with a masslike density in the lower thoracic esophagus measuring 2.8 x 3.7 cm on image 32. This is suspicious for esophageal carcinoma. New high right paratracheal mediastinal adenopathy is seen measuring 1.7 cm on image 8, consistent with metastatic disease and there also tiny less than 1 cm right supraclavicular lymph nodes, which are new since previous study.  An 11 mm pulmonary nodule is seen in the right middle  lobe on image 29 and a 6 mm nodule is seen in central left lower lobe on image 33, highly suspicious for pulmonary metastases. A few other tiny scattered less than 5 mm right lung nodules are seen which are nonspecific on images 11 and 21.  Patchy airspace opacity is seen in the right lung base, suspicious for pneumonia or aspiration. Mild atelectasis or scarring noted in the left lung base. No evidence of pleural effusion.  Small lymph nodes are seen in the gastric patent ligament measuring up to 9 mm, suspicious for early lymph node metastases. A 1.9 cm right adrenal mass is also seen which is new and consistent with adrenal metastasis. No suspicious bone lesions identified  IMPRESSION: Distal esophageal masslike density measuring 3.7 cm, suspicious for primary esophageal carcinoma.  Consider endoscopy for further evaluation, as well as PET-CT scan for staging.  Right paratracheal lymphadenopathy, consistent with metastatic disease. Shotty less than 1 cm lymph nodes in the gastrohepatic ligament and right supraclavicular region are also suspicious for early lymph node metastases.  Small bilateral pulmonary metastases, as described above.  Right lower lung airspace disease, suspicious for pneumonia or aspiration. Mild left basilar atelectasis or scarring.  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   Electronically Signed   By: Earle Gell M.D.   On: 02/27/2014 14:50    EKG:   Assessment/Plan Principal Problem:   Right upper quadrant pain Active Problems:   Esophageal mass   Adrenal mass, right   CAP (community acquired pneumonia)   Nausea & vomiting   Dysphagia   Tobacco abuse   Poor dentition   Metabolic alkalosis   Hypothyroidism   Macrocytosis   1. This is a 58 year old woman who presents with right upper quadrant pain and nausea/vomiting for the past 3 weeks. Given the CT findings, it is likely that her pain is secondary to community-acquired pneumonia and possibly  the adrenal mass. Her nausea and vomiting and dysphagia could be a manifestation of the esophageal mass, most likely esophageal cancer. The pulmonary nodules, lymphadenopathy and adrenal mass are concerning for metastatic disease.    Plan: 1. Given the extent of the findings on the CT, the patient is being transferred to [facility] where my colleague, Dr. Charlies Silvers will be accepting the patient for further evaluation and management. The patient will need further imaging with a PET scan, gastroenterology consultation, and possibly oncology consultation. 2. The patient received Levaquin in the emergency department. This will be continued to treat community-acquired pneumonia. 3. Replete potassium chloride in the IV fluids and with IV runs x2. We'll check a magnesium level to rule out deficiency. 4. We'll order a vitamin B12 and TSH for evaluation of macrocytosis and for known hypothyroidism. 5. Gentle IV fluid hydration. 6. Protonix IV empirically. Zofran as needed for nausea and vomiting. (She has no nausea vomiting currently). 7. We'll allow her clear liquid diet and n.p.o. in the morning. 8. We'll place a nicotine patch. We'll order tobacco cessation counseling. 9. Analgesics and other supportive treatments as appropriate. 10. As above, she will need a GI consultation and an oncology consultation at [facility].    Code Status: Full code Family Communication: Discussed with husband. Disposition Plan: To be determined.  Time spent: One hour.  [facility] Pager [phone]

## 2014-02-27 NOTE — ED Notes (Signed)
Pt c/o right side upper abd area that radiates around to back area with n/v that started three weeks ago, has been followed by Dr Hilma Favors, had chest xray, and ct scan without contrast yesterday but states that the pain became worse today,

## 2014-02-28 DIAGNOSIS — R109 Unspecified abdominal pain: Secondary | ICD-10-CM

## 2014-02-28 DIAGNOSIS — R112 Nausea with vomiting, unspecified: Secondary | ICD-10-CM

## 2014-02-28 LAB — CBC
HCT: 42.9 % (ref 36.0–46.0)
Hemoglobin: 13.7 g/dL (ref 12.0–15.0)
MCH: 34.9 pg — ABNORMAL HIGH (ref 26.0–34.0)
MCHC: 31.9 g/dL (ref 30.0–36.0)
MCV: 109.2 fL — AB (ref 78.0–100.0)
PLATELETS: 187 10*3/uL (ref 150–400)
RBC: 3.93 MIL/uL (ref 3.87–5.11)
RDW: 14.5 % (ref 11.5–15.5)
WBC: 4.7 10*3/uL (ref 4.0–10.5)

## 2014-02-28 LAB — COMPREHENSIVE METABOLIC PANEL
ALK PHOS: 133 U/L — AB (ref 39–117)
ALT: 20 U/L (ref 0–35)
AST: 17 U/L (ref 0–37)
Albumin: 2.4 g/dL — ABNORMAL LOW (ref 3.5–5.2)
BILIRUBIN TOTAL: 0.7 mg/dL (ref 0.3–1.2)
BUN: 4 mg/dL — ABNORMAL LOW (ref 6–23)
CHLORIDE: 101 meq/L (ref 96–112)
CO2: 32 meq/L (ref 19–32)
CREATININE: 0.69 mg/dL (ref 0.50–1.10)
Calcium: 9.4 mg/dL (ref 8.4–10.5)
GFR calc Af Amer: >90 mL/min (ref >90)
GFR calc non Af Amer: >90 mL/min (ref >90)
GLUCOSE: 92 mg/dL (ref 70–99)
POTASSIUM: 3.8 meq/L (ref 3.7–5.3)
Sodium: 143 mEq/L (ref 137–147)
Total Protein: 5.6 g/dL — ABNORMAL LOW (ref 6.0–8.3)

## 2014-02-28 LAB — VITAMIN B12: VITAMIN B 12: 1215 pg/mL — AB (ref 211–911)

## 2014-02-28 LAB — TSH: TSH: 0.243 u[IU]/mL — ABNORMAL LOW (ref 0.350–4.500)

## 2014-02-28 NOTE — Progress Notes (Deleted)
Patient was stable at time of discharge. Removed IV. Reviewed discharge education with patient. He verbalized understanding and had no further questions. Patient left with prescriptions in hand.

## 2014-02-28 NOTE — Progress Notes (Signed)
[facility] PROGRESS NOTE  Elizabeth Moss NAT:[id number] DOB: [birth year] DOA: 02/27/2014 PCP: [name], MD  Brief narrative: 58 y.o. female with a history of hypothyroidism and hyperlipidemia, who presented to [facility] ED 02/27/2014 with 3 week history of nausea, vomiting and right upper quadrant abdominal pain radiating to right shoulder and upper back. No reports of blood in stool or urine. Pt also reported occasional inconsistent dry cough. 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 She was transferred to [facility] for further evaluation and management.   Assessment/Plan:  Principal Problem:   Right upper quadrant pain, nausea and vomiting - likely related to esophageal mass seen on CT chest. Will ask GI for input on further management. At this time pt is not complaining of N/V.  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   Objective: Filed Vitals:   02/27/14 1837  02/27/14 1850 02/27/14 2104 02/28/14 0521  BP: 107/67 107/67 94/59 90/53   Pulse: 84 73 75 85  Temp: 98.4 F (36.9 C) 98.2 F (36.8 C) 97.8 F (36.6 C) 98.7 F (37.1 C)  TempSrc: Oral  Oral Oral  Resp: 18 18 20 16   Height:   5\' 4"  (1.626 m)   Weight:   72.122 kg (159 lb)   SpO2: 95% 96% 100% 97%    Intake/Output Summary (Last 24 hours) at 02/28/14 0920 Last data filed at 02/28/14 0600  Gross per 24 hour  Intake 1196.25 ml  Output    300 ml  Net 896.25 ml    Exam:   General:  Pt is alert, follows commands appropriately, not in acute distress  Cardiovascular: Regular rate and rhythm, S1/S2 appreciated  Respiratory: mild rhonchi in upper lung lobes otherwise no wheezing   Abdomen: Soft, non tender, non distended, bowel sounds present, no guarding  Extremities: No edema, pulses DP and PT palpable bilaterally  Neuro: Grossly nonfocal  Data Reviewed: Basic Metabolic Panel:  Recent Labs Lab 02/27/14 1349 02/28/14 0644  NA 142 143  K 3.4* 3.8  CL 95* 101  CO2 39* 32  GLUCOSE 116* 92  BUN 7 4*  CREATININE 0.67 0.69  CALCIUM 10.3 9.4  MG 2.3  --    Liver Function Tests:  Recent Labs Lab 02/27/14 1349 02/28/14 0644  AST 23 17  ALT 28 20  ALKPHOS 182* 133*  BILITOT 0.9 0.7  PROT 7.4 5.6*  ALBUMIN 3.0* 2.4*   No results found for this basename: LIPASE, AMYLASE,  in the last 168 hours No results found for this basename: AMMONIA,  in the last 168 hours CBC:  Recent Labs Lab 02/27/14 1349 02/28/14 0644  WBC 6.7 4.7  NEUTROABS 5.2  --   HGB 16.2* 13.7  HCT 49.5* 42.9  MCV 110.2* 109.2*  PLT 195 187   Cardiac Enzymes: No results found for this basename: CKTOTAL, CKMB, CKMBINDEX, TROPONINI,  in the last 168 hours BNP: No components found with this basename: POCBNP,  CBG: No results found for this basename: GLUCAP,  in the last 168 hours  Recent Results (from the past 240 hour(s))  CULTURE, BLOOD (ROUTINE X 2)     Status: None   Collection Time     02/27/14  1:50 PM      Result Value Ref Range Status   Specimen Description LEFT ANTECUBITAL   Final   Special Requests BOTTLES DRAWN AEROBIC AND ANAEROBIC 10CC   Final   Culture NO GROWTH 1 DAY   Final   Report Status PENDING   Incomplete  CULTURE, BLOOD (ROUTINE X 2)     Status: None   Collection Time    02/27/14  1:52 PM      Result Value Ref Range Status   Specimen Description RIGHT ANTECUBITAL   Final   Special Requests BOTTLES DRAWN AEROBIC AND ANAEROBIC 8CC   Final   Culture NO GROWTH 1 DAY   Final   Report Status PENDING   Incomplete     Studies: Ct Abdomen Wo Contrast 02/26/2014     IMPRESSION: 1. Thickening of the distal esophagus is concerning for esophageal carcinoma. 2. New enlargement of the right adrenal gland is concerning for metastasis. 3. Linear nodular branching pattern in the right lower lobe is nonspecific and could represent infection or neoplasm. 4. Recommend GI consultation and endoscopy to evaluate esophagus. Patient may benefit from FDG PET scan for further evaluation of these concerning findings.   Dg Chest 2 View 02/26/2014    IMPRESSION: 1. There is patchy anterior right lower lobe pneumonia and there is lingular atelectasis on the left. 2. Mildly increased interstitial markings are present bilaterally consistent with the patient's smoking history. 3. There is no evidence of CHF nor pleural effusion.     Ct Chest Wo Contrast 02/27/2014    IMPRESSION: Distal esophageal masslike density measuring 3.7 cm, suspicious for primary esophageal carcinoma. Consider endoscopy for further evaluation, as well as PET-CT scan for staging.  Right paratracheal lymphadenopathy, consistent with metastatic disease. Shotty less than 1 cm lymph nodes in the gastrohepatic ligament and right supraclavicular region are also suspicious for early lymph node metastases.  Small bilateral pulmonary metastases, as described above.  Right lower lung airspace disease, suspicious for pneumonia  or aspiration. Mild left basilar atelectasis or scarring.  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      Scheduled Meds: . levofloxacin (LEVAQUIN)  750 mg Intravenous Q24H  . levothyroxine  88 mcg Oral QAC breakfast  . nicotine  21 mg Transdermal Daily  . pantoprazole  40 mg Intravenous Q24H   Continuous Infusions: . 0.9 % NaCl with KCl 20 mEq / L 75 mL/hr at 02/27/14 2203

## 2014-02-28 NOTE — Consult Note (Addendum)
[facility] Consult Note  Referring Provider: No ref. provider found Primary Care Physician:  [name], MD Primary Gastroenterologist:  Dr.  Laurel Dimmer Complaint: Right upper quadrant pain HPI: Elizabeth Moss is an 58 y.o. white female  who presented to the emergency room in [place] with progressive right upper quadrant abdominal pain radiating to the right shoulder. She also admitted to some dysphagia primarily for solids over the last 3 or 4 weeks. She has had some difficulty with pills as well and admits to an approximately 15 pound weight loss. 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 There is also a 1.9 cm right adrenal mass consistent with metastasis.  Past Medical History  Diagnosis Date  . Hyperlipidemia   . Hypothyroidism   . Tobacco abuse     Past Surgical History  Procedure Laterality Date  . Abdominal hysterectomy  2000  . Cholecystectomy  1986  . Mandible surgery      lower and upper  . Cesarean section    . Dilation and curettage of uterus      Medications Prior to Admission  Medication Sig Dispense Refill  . levothyroxine (SYNTHROID, LEVOTHROID) 88 MCG tablet Take 88 mcg by mouth daily before breakfast.      . aspirin 81 MG tablet Take 81 mg by mouth daily.      Marland Kitchen atorvastatin (LIPITOR) 40 MG tablet Take 20 mg by mouth daily.       . Omega-3 Fatty Acids (FISH OIL PO) Take 1 capsule by mouth daily.       . ondansetron (ZOFRAN-ODT) 4 MG disintegrating tablet Take 1 tablet by mouth as needed. nausea        Allergies:  Allergies  Allergen Reactions  . Iohexol Anaphylaxis     Desc: PATIENT GOES INTO ANAPHALATIC SHOCK AFTER INJECTION OF OMNIPAQUE, Onset Date: [id number]   . Tetracyclines & Related Rash    Family History  Problem Relation Age of Onset   . Hyperlipidemia Mother   . Other Mother     varicose veins    Social History:  reports that she has been smoking Cigarettes.  She has a 17.5 pack-year smoking history. She has never used smokeless tobacco. She reports that she does not drink alcohol or use illicit drugs.  Review of Systems: negative except as above   Blood pressure 90/53, pulse 85, temperature 98.7 F (37.1 C), temperature source Oral, resp. rate 16, height '5\' 4"'  (1.626 m), weight 72.122 kg (159 lb), SpO2 97.00%. Head: Normocephalic, without obvious abnormality, atraumatic Neck: no adenopathy, no carotid bruit, no JVD, supple, symmetrical, trachea midline and thyroid not enlarged, symmetric, no tenderness/mass/nodules Resp: clear to auscultation bilaterally Cardio: regular rate and rhythm, S1, S2 normal, no murmur, click, rub or gallop GI: Abdomen soft nondistended with normoactive bowel sounds. No hepatomegaly masses or guarding. Extremities: extremities normal, atraumatic, no cyanosis or edema  Results for orders placed during the hospital encounter of 02/27/14 (from the past 48 hour(s))  CBC WITH DIFFERENTIAL     Status: Abnormal   Collection Time    02/27/14  1:49 PM      Result Value Ref Range   WBC 6.7  4.0 - 10.5 K/uL   RBC 4.49  3.87 - 5.11 MIL/uL   Hemoglobin 16.2 (*) 12.0 - 15.0 g/dL   HCT 49.5 (*)  36.0 - 46.0 %   MCV 110.2 (*) 78.0 - 100.0 fL   MCH 36.1 (*) 26.0 - 34.0 pg   MCHC 32.7  30.0 - 36.0 g/dL   RDW 14.6  11.5 - 15.5 %   Platelets 195  150 - 400 K/uL   Neutrophils Relative % 77  43 - 77 %   Lymphocytes Relative 13  12 - 46 %   Monocytes Relative 8  3 - 12 %   Eosinophils Relative 2  0 - 5 %   Basophils Relative 0  0 - 1 %   Neutro Abs 5.2  1.7 - 7.7 K/uL   Lymphs Abs 0.9  0.7 - 4.0 K/uL   Monocytes Absolute 0.5  0.1 - 1.0 K/uL   Eosinophils Absolute 0.1  0.0 - 0.7 K/uL   Basophils Absolute 0.0  0.0 - 0.1 K/uL  COMPREHENSIVE METABOLIC PANEL     Status: Abnormal   Collection Time     02/27/14  1:49 PM      Result Value Ref Range   Sodium 142  137 - 147 mEq/L   Potassium 3.4 (*) 3.7 - 5.3 mEq/L   Chloride 95 (*) 96 - 112 mEq/L   CO2 39 (*) 19 - 32 mEq/L   Glucose, Bld 116 (*) 70 - 99 mg/dL   BUN 7  6 - 23 mg/dL   Creatinine, Ser 0.67  0.50 - 1.10 mg/dL   Calcium 10.3  8.4 - 10.5 mg/dL   Total Protein 7.4  6.0 - 8.3 g/dL   Albumin 3.0 (*) 3.5 - 5.2 g/dL   AST 23  0 - 37 U/L   ALT 28  0 - 35 U/L   Alkaline Phosphatase 182 (*) 39 - 117 U/L   Total Bilirubin 0.9  0.3 - 1.2 mg/dL   GFR calc non Af Amer >90  >90 mL/min   GFR calc Af Amer >90  >90 mL/min   Comment: (NOTE)     The eGFR has been calculated using the CKD EPI equation.     This calculation has not been validated in all clinical situations.     eGFR's persistently <90 mL/min signify possible Chronic Kidney     Disease.  MAGNESIUM     Status: None   Collection Time    02/27/14  1:49 PM      Result Value Ref Range   Magnesium 2.3  1.5 - 2.5 mg/dL  CULTURE, BLOOD (ROUTINE X 2)     Status: None   Collection Time    02/27/14  1:50 PM      Result Value Ref Range   Specimen Description LEFT ANTECUBITAL     Special Requests BOTTLES DRAWN AEROBIC AND ANAEROBIC 10CC     Culture NO GROWTH 1 DAY     Report Status PENDING    CULTURE, BLOOD (ROUTINE X 2)     Status: None   Collection Time    02/27/14  1:52 PM      Result Value Ref Range   Specimen Description RIGHT ANTECUBITAL     Special Requests BOTTLES DRAWN AEROBIC AND ANAEROBIC 8CC     Culture NO GROWTH 1 DAY     Report Status PENDING    LACTIC ACID, PLASMA     Status: None   Collection Time    02/27/14  1:52 PM      Result Value Ref Range   Lactic Acid, Venous 1.6  0.5 - 2.2 mmol/L  COMPREHENSIVE METABOLIC PANEL  Status: Abnormal   Collection Time    02/28/14  6:44 AM      Result Value Ref Range   Sodium 143  137 - 147 mEq/L   Potassium 3.8  3.7 - 5.3 mEq/L   Chloride 101  96 - 112 mEq/L   CO2 32  19 - 32 mEq/L   Glucose, Bld 92  70 - 99  mg/dL   BUN 4 (*) 6 - 23 mg/dL   Creatinine, Ser 0.69  0.50 - 1.10 mg/dL   Calcium 9.4  8.4 - 10.5 mg/dL   Total Protein 5.6 (*) 6.0 - 8.3 g/dL   Albumin 2.4 (*) 3.5 - 5.2 g/dL   AST 17  0 - 37 U/L   ALT 20  0 - 35 U/L   Alkaline Phosphatase 133 (*) 39 - 117 U/L   Total Bilirubin 0.7  0.3 - 1.2 mg/dL   GFR calc non Af Amer >90  >90 mL/min   GFR calc Af Amer >90  >90 mL/min   Comment: (NOTE)     The eGFR has been calculated using the CKD EPI equation.     This calculation has not been validated in all clinical situations.     eGFR's persistently <90 mL/min signify possible Chronic Kidney     Disease.  CBC     Status: Abnormal   Collection Time    02/28/14  6:44 AM      Result Value Ref Range   WBC 4.7  4.0 - 10.5 K/uL   RBC 3.93  3.87 - 5.11 MIL/uL   Hemoglobin 13.7  12.0 - 15.0 g/dL   HCT 42.9  36.0 - 46.0 %   MCV 109.2 (*) 78.0 - 100.0 fL   MCH 34.9 (*) 26.0 - 34.0 pg   MCHC 31.9  30.0 - 36.0 g/dL   RDW 14.5  11.5 - 15.5 %   Platelets 187  150 - 400 K/uL   Ct Abdomen Wo Contrast  02/26/2014   CLINICAL DATA:  Right upper quadrant pain  EXAM: CT ABDOMEN WITHOUT CONTRAST  TECHNIQUE: Multidetector CT imaging of the abdomen was performed following the standard protocol without IV contrast.  COMPARISON:  DG ABD ACUTE W/CHEST dated 02/01/2014  FINDINGS: There is a branching linear nodular pattern within the right lower lobe within air bronchograms (image number 5 and 6). This is new from prior.  No focal hepatic lesion on this noncontrast exam. Post cholecystectomy. The pancreas, spleen, and kidneys are unremarkable on this noncontrast exam. There is an exophytic low-density round lesion extending from the left kidney which likely represent benign cysts.  The right adrenal gland is enlarged measuring 17 mm (image 16). This enlargement of appears new from comparison chest CT 11/03/2012 and is new from CT of 06/25/2008.  There is circumferential thickening of the distal esophagus approximately  4 cm above the gastroesophageal junction. Single wall thickness measures 17 mm. 7 mm paraesophageal lymph node (image 5).  The stomach is normal. Limited view of the small bowel is unremarkable. No retroperitoneal adenopathy. Small gastrohepatic ligament lymph nodes measure 10 mm.  IMPRESSION: 1. Thickening of the distal esophagus is concerning for esophageal carcinoma. 2. New enlargement of the right adrenal gland is concerning for metastasis. 3. Linear nodular branching pattern in the right lower lobe is nonspecific and could represent infection or neoplasm. 4. Recommend GI consultation and endoscopy to evaluate esophagus. Patient may benefit from FDG PET scan for further evaluation of these concerning findings. These results will  be called to the ordering clinician or representative by the Radiologist Assistant, and communication documented in the PACS Dashboard.   Electronically Signed   By: Suzy Bouchard M.D.   On: 02/26/2014 18:13   Dg Chest 2 View  02/26/2014   CLINICAL DATA:  Right lower chest pain, history of tobacco use.  EXAM: CHEST  2 VIEW  COMPARISON:  DG ABD ACUTE W/CHEST dated 02/01/2014  FINDINGS: There is increased density anteriorly in the right lower lobe consistent with pneumonia. There is subsegmental atelectasis in the lingula. The left lower lobe and both upper lobes exhibit no infiltrates or atelectasis. Stable mildly increased interstitial markings are present in both lungs. The cardiac silhouette is normal in size. The pulmonary vascularity is not engorged. There is mild tortuosity of the descending thoracic aorta. There is no pleural effusion. The observed portions of the bony thorax exhibit no acute abnormalities.  IMPRESSION: 1. There is patchy anterior right lower lobe pneumonia and there is lingular atelectasis on the left. 2. Mildly increased interstitial markings are present bilaterally consistent with the patient's smoking history. 3. There is no evidence of CHF nor pleural  effusion.   Electronically Signed   By: David  Martinique   On: 02/26/2014 16:15   Ct Chest Wo Contrast  02/27/2014   CLINICAL DATA:  Chest pain. Bibasilar infiltrates. Severe contrast allergy.  EXAM: CT CHEST WITHOUT CONTRAST  TECHNIQUE: Multidetector CT imaging of the chest was performed following the standard protocol without IV contrast.  COMPARISON:  11/03/2012  FINDINGS: Fluid-filled upper thoracic esophagus is seen with a masslike density in the lower thoracic esophagus measuring 2.8 x 3.7 cm on image 32. This is suspicious for esophageal carcinoma. New high right paratracheal mediastinal adenopathy is seen measuring 1.7 cm on image 8, consistent with metastatic disease and there also tiny less than 1 cm right supraclavicular lymph nodes, which are new since previous study.  An 11 mm pulmonary nodule is seen in the right middle lobe on image 29 and a 6 mm nodule is seen in central left lower lobe on image 33, highly suspicious for pulmonary metastases. A few other tiny scattered less than 5 mm right lung nodules are seen which are nonspecific on images 11 and 21.  Patchy airspace opacity is seen in the right lung base, suspicious for pneumonia or aspiration. Mild atelectasis or scarring noted in the left lung base. No evidence of pleural effusion.  Small lymph nodes are seen in the gastric patent ligament measuring up to 9 mm, suspicious for early lymph node metastases. A 1.9 cm right adrenal mass is also seen which is new and consistent with adrenal metastasis. No suspicious bone lesions identified  IMPRESSION: Distal esophageal masslike density measuring 3.7 cm, suspicious for primary esophageal carcinoma. Consider endoscopy for further evaluation, as well as PET-CT scan for staging.  Right paratracheal lymphadenopathy, consistent with metastatic disease. Shotty less than 1 cm lymph nodes in the gastrohepatic ligament and right supraclavicular region are also suspicious for early lymph node metastases.   Small bilateral pulmonary metastases, as described above.  Right lower lung airspace disease, suspicious for pneumonia or aspiration. Mild left basilar atelectasis or scarring.  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   Electronically Signed   By: Earle Gell M.D.   On: 02/27/2014 14:50    Assessment: 1. Esophageal mass suspicious for carcinoma 2. Community-acquired right-sided pneumonia 3. 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 EGD with biopsy of the esophageal lesion at some  point, we'll discuss with primary M.D. whether to wait a day or 2 for further treatment of her pneumonia, then involvement of oncology if malignant as expected. Addendum, discussed with Dr. Charlies Silvers, who felt that she would be acceptable for EGD from the standpoint of pneumonia tomorrow. Tentatively scheduled for 10 AM with Dr. Brock Ra 02/28/2014, 9:33 AM

## 2014-03-01 ENCOUNTER — Inpatient Hospital Stay (HOSPITAL_COMMUNITY): Payer: 59 | Admitting: Anesthesiology

## 2014-03-01 ENCOUNTER — Encounter (HOSPITAL_COMMUNITY): Payer: Self-pay

## 2014-03-01 ENCOUNTER — Encounter (HOSPITAL_COMMUNITY)
Admission: EM | Disposition: A | Discharge status: Home / Self Care | Payer: Self-pay | Source: Home / Self Care | Attending: Internal Medicine

## 2014-03-01 ENCOUNTER — Encounter (HOSPITAL_COMMUNITY): Payer: 59 | Admitting: Anesthesiology

## 2014-03-01 ENCOUNTER — Encounter (HOSPITAL_COMMUNITY)
Admission: EM | Disposition: A | Discharge status: Home / Self Care | Payer: 59 | Source: Home / Self Care | Attending: Internal Medicine

## 2014-03-01 DIAGNOSIS — R1310 Dysphagia, unspecified: Secondary | ICD-10-CM

## 2014-03-01 HISTORY — PX: ESOPHAGOGASTRODUODENOSCOPY (EGD) WITH PROPOFOL: SHX5813

## 2014-03-01 LAB — MRSA PCR SCREENING: MRSA by PCR: NEGATIVE

## 2014-03-01 SURGERY — EGD (ESOPHAGOGASTRODUODENOSCOPY)
Anesthesia: Moderate Sedation

## 2014-03-01 SURGERY — ESOPHAGOGASTRODUODENOSCOPY (EGD) WITH PROPOFOL
Anesthesia: Monitor Anesthesia Care

## 2014-03-01 MED ORDER — PROPOFOL INFUSION 10 MG/ML OPTIME
INTRAVENOUS | Status: DC | PRN
  Administered 2014-03-01: 100 ug/kg/min via INTRAVENOUS

## 2014-03-01 MED ORDER — ONDANSETRON HCL 4 MG/2ML IJ SOLN
INTRAMUSCULAR | Status: DC | PRN
  Administered 2014-03-01: 4 mg via INTRAVENOUS

## 2014-03-01 MED ORDER — LACTATED RINGERS IV SOLN
INTRAVENOUS | Status: DC
  Administered 2014-03-01: 10:00:00 via INTRAVENOUS

## 2014-03-01 MED ORDER — BUTAMBEN-TETRACAINE-BENZOCAINE 2-2-14 % EX AERO
INHALATION_SPRAY | CUTANEOUS | Status: DC | PRN
  Administered 2014-03-01: 2 via TOPICAL

## 2014-03-01 MED ORDER — MIDAZOLAM HCL 2 MG/2ML IJ SOLN
INTRAMUSCULAR | Status: AC
  Filled 2014-03-01: qty 2

## 2014-03-01 MED ORDER — KETAMINE HCL 10 MG/ML IJ SOLN
INTRAMUSCULAR | Status: DC | PRN
  Administered 2014-03-01: 20 mg via INTRAVENOUS

## 2014-03-01 MED ORDER — MIDAZOLAM HCL 5 MG/5ML IJ SOLN
INTRAMUSCULAR | Status: DC | PRN
  Administered 2014-03-01: 1 mg via INTRAVENOUS

## 2014-03-01 MED ORDER — PROPOFOL 10 MG/ML IV BOLUS
INTRAVENOUS | Status: AC
  Filled 2014-03-01: qty 20

## 2014-03-01 MED ORDER — SODIUM CHLORIDE 0.9 % IV SOLN: INTRAVENOUS | Status: DC

## 2014-03-01 MED ORDER — LIDOCAINE HCL (CARDIAC) 20 MG/ML IV SOLN
INTRAVENOUS | Status: DC | PRN
  Administered 2014-03-01: 100 mg via INTRAVENOUS

## 2014-03-01 MED ORDER — ONDANSETRON HCL 4 MG/2ML IJ SOLN
INTRAMUSCULAR | Status: AC
  Filled 2014-03-01: qty 2

## 2014-03-01 MED ORDER — LIDOCAINE HCL (CARDIAC) 20 MG/ML IV SOLN
INTRAVENOUS | Status: AC
  Filled 2014-03-01: qty 5

## 2014-03-01 MED ORDER — FENTANYL CITRATE 0.05 MG/ML IJ SOLN
INTRAMUSCULAR | Status: AC
  Filled 2014-03-01: qty 2

## 2014-03-01 NOTE — Anesthesia Postprocedure Evaluation (Signed)
Anesthesia Post Note  Patient: Elizabeth Moss  Procedure(s) Performed: Procedure(s) (LRB): ESOPHAGOGASTRODUODENOSCOPY (EGD) WITH PROPOFOL (N/A)  Anesthesia type: MAC  Patient location: PACU  Post pain: Pain level controlled  Post assessment: Post-op Vital signs reviewed  Last Vitals: BP 120/65  Pulse 76  Temp(Src) 36.9 C (Oral)  Resp 23  Ht 5\' 4"  (1.626 m)  Wt 159 lb (72.122 kg)  BMI 27.28 kg/m2  SpO2 95%  Post vital signs: Reviewed  Level of consciousness: awake  Complications: No apparent anesthesia complications

## 2014-03-01 NOTE — Anesthesia Preprocedure Evaluation (Addendum)
Anesthesia Evaluation  Patient identified by MRN, date of birth, ID band Patient awake    Reviewed: Allergy & Precautions, H&P , NPO status , Patient's Chart, lab work & pertinent test results  Airway Mallampati: II TM Distance: >3 FB Neck ROM: Full    Dental  (+) Dental Advisory Given   Pulmonary pneumonia -, unresolved, Current Smoker,  breath sounds clear to auscultation        Cardiovascular + Peripheral Vascular Disease Rhythm:Regular Rate:Normal     Neuro/Psych negative neurological ROS  negative psych ROS   GI/Hepatic negative GI ROS, Neg liver ROS,   Endo/Other  Hypothyroidism   Renal/GU negative Renal ROS     Musculoskeletal negative musculoskeletal ROS (+)   Abdominal   Peds  Hematology negative hematology ROS (+)   Anesthesia Other Findings   Reproductive/Obstetrics negative OB ROS                          Anesthesia Physical Anesthesia Plan  ASA: III  Anesthesia Plan: MAC   Post-op Pain Management:    Induction:   Airway Management Planned:   Additional Equipment:   Intra-op Plan:   Post-operative Plan:   Informed Consent: I have reviewed the patients History and Physical, chart, labs and discussed the procedure including the risks, benefits and alternatives for the proposed anesthesia with the patient or authorized representative who has indicated his/her understanding and acceptance.   Dental advisory given  Plan Discussed with: CRNA  Anesthesia Plan Comments:         Anesthesia Quick Evaluation

## 2014-03-01 NOTE — Transfer of Care (Signed)
Immediate Anesthesia Transfer of Care Note  Patient: Elizabeth Moss  Procedure(s) Performed: Procedure(s) (LRB): ESOPHAGOGASTRODUODENOSCOPY (EGD) WITH PROPOFOL (N/A)  Patient Location: PACU  Anesthesia Type: MAC  Level of Consciousness: sedated, patient cooperative and responds to stimulation  Airway & Oxygen Therapy: Patient Spontanous Breathing and Patient connected to face mask oxgen  Post-op Assessment: Report given to PACU RN and Post -op Vital signs reviewed and stable  Post vital signs: Reviewed and stable  Complications: No apparent anesthesia complications

## 2014-03-01 NOTE — Preoperative (Signed)
Beta Blockers   Reason not to administer Beta Blockers:Not Applicable 

## 2014-03-01 NOTE — Op Note (Signed)
[facility] [place], [address]   ENDOSCOPY PROCEDURE REPORT  PATIENT: Elizabeth, Moss  MR#: [id number] BIRTHDATE: 1956-06-08 , 66  yrs. old GENDER: Female ENDOSCOPIST: Acquanetta Sit, MD REFERRED BY: PROCEDURE DATE:  03/01/2014 PROCEDURE:   EGD with biopsy ASA CLASS: 3 INDICATIONS: dysphagia, abnormal CT scan showing mass lesion in the esophagus MEDICATIONS: propofol per anesthesia TOPICAL ANESTHETIC:  DESCRIPTION OF PROCEDURE:   After the risks benefits and alternatives of the procedure were thoroughly explained, informed consent was obtained.  The Pentax       endoscope was introduced through the mouth and advanced to the second portion of the duodenum      , limited by Without limitations.   The instrument was slowly withdrawn as the mucosa was fully examined.      FINDINGS:  Esophagus: In the distal esophagus there was a several centimeter long circumferential mass lesion causing some obstruction of the lumen however the scope was able to be passed beyond this. Multiple biopsies of this lesion were obtained. It looks like an obvious malignancy. It extended up from the esophagogastric junction region several centimeters into the esophagus. It could not be seen on retroflexion in the stomach.  Stomach: Normal  Duodenum: Normal  COMPLICATIONS:none  ENDOSCOPIC IMPRESSION:distal esophageal cancer pathology pending   RECOMMENDATIONS:check biopsies      _______________________________ Lorrin MaisAcquanetta Sit, MD 03/01/2014 12:55 PM

## 2014-03-01 NOTE — Progress Notes (Signed)
[facility] PROGRESS NOTE  Elizabeth Moss DVV:[id number] DOB: 19-Dec-1955 DOA: 02/27/2014 PCP: [name], MD  Brief narrative: 58 y.o. female with a history of hypothyroidism and hyperlipidemia, who presented to [facility] ED 02/27/2014 with 3 week history of nausea, vomiting and right upper quadrant abdominal pain radiating to right shoulder and upper back. No reports of blood in stool or urine. Pt also reported occasional inconsistent dry cough. 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 She was transferred to [facility] for further evaluation and management. Plan is for EGD this am.  Assessment/Plan:   Principal Problem:  Right upper quadrant pain, nausea and vomiting  Likely related to esophageal mass seen on CT chest. Plan for EGD per GI recommendations. I spoke with Dr. Benay Spice of oncology and he recommended waiting for biopsy results na dif confirmatory of malignancy to call oncology for an official consult. We will continue IV fluids and antiemetics as needed. Pain management with morphine 4 mg every 3 hours IV PRN. Patient is on PPI therapy once a day.  Active Problems:  CAP (community acquired pneumonia)  Continue Levaquin daily. Blood cultures to date are negative. Esophageal mass  Concerning for esophageal carcinoma. Patient will go for endoscopy today. We will followup on biopsy results. Hypothyroidism  Continue levothyroxine.   Code Status: full code  Family Communication: no family at the bedside  Disposition Plan: home when stable   Consultants:  GI Procedures:  Endoscopy 03/01/2014  Antibiotics:  Levaquin 02/27/2014 -->  Leisa Lenz,  MD  [facility] Pager [phone]  If 7PM-7AM, please contact night-coverage [email or web address] Password [facility] 03/01/2014, 1:46 PM   LOS: 2 days    HPI/Subjective: No overnight events.  Objective: Filed Vitals:   03/01/14 1305 03/01/14 1310 03/01/14 1320 03/01/14 1330  BP: 117/55 117/55 117/55   Pulse:      Temp:      TempSrc:      Resp: 19 21 15 23   Height:      Weight:      SpO2: 96% 96% 95% 97%    Intake/Output Summary (Last 24 hours) at 03/01/14 1346 Last data filed at 03/01/14 1258  Gross per 24 hour  Intake   1500 ml  Output      0 ml  Net   1500 ml    Exam:   General:  Pt is alert, follows commands appropriately, not in acute distress  Cardiovascular: Regular rate and rhythm, S1/S2, no murmurs  Respiratory: Clear to auscultation bilaterally, no wheezing, no crackles, no rhonchi  Abdomen: Soft, non tender, non distended, bowel sounds present, no guarding  Extremities: No edema, pulses DP and PT palpable bilaterally  Neuro: Grossly nonfocal  Data Reviewed: Basic Metabolic Panel:  Recent Labs Lab 02/27/14 1349 02/28/14 0644  NA 142 143  K 3.4* 3.8  CL 95* 101  CO2 39* 32  GLUCOSE 116* 92  BUN 7 4*  CREATININE 0.67 0.69  CALCIUM 10.3 9.4  MG 2.3  --    Liver Function Tests:  Recent Labs Lab 02/27/14 1349 02/28/14 0644  AST 23 17  ALT 28 20  ALKPHOS 182* 133*  BILITOT 0.9 0.7  PROT 7.4 5.6*  ALBUMIN 3.0* 2.4*   No results  found for this basename: LIPASE, AMYLASE,  in the last 168 hours No results found for this basename: AMMONIA,  in the last 168 hours CBC:  Recent Labs Lab 02/27/14 1349 02/28/14 0644  WBC 6.7 4.7  NEUTROABS 5.2  --   HGB 16.2* 13.7  HCT 49.5* 42.9  MCV 110.2* 109.2*  PLT 195 187   Cardiac Enzymes: No results found for this basename: CKTOTAL, CKMB, CKMBINDEX, TROPONINI,  in the last 168 hours BNP: No components found with this basename: POCBNP,  CBG: No results found for this basename: GLUCAP,  in the  last 168 hours  Recent Results (from the past 240 hour(s))  CULTURE, BLOOD (ROUTINE X 2)     Status: None   Collection Time    02/27/14  1:50 PM      Result Value Ref Range Status   Specimen Description LEFT ANTECUBITAL   Final   Special Requests BOTTLES DRAWN AEROBIC AND ANAEROBIC 10CC   Final   Culture NO GROWTH 2 DAYS   Final   Report Status PENDING   Incomplete  CULTURE, BLOOD (ROUTINE X 2)     Status: None   Collection Time    02/27/14  1:52 PM      Result Value Ref Range Status   Specimen Description RIGHT ANTECUBITAL   Final   Special Requests BOTTLES DRAWN AEROBIC AND ANAEROBIC 8CC   Final   Culture NO GROWTH 2 DAYS   Final   Report Status PENDING   Incomplete     Studies: Ct Chest Wo Contrast 02/27/2014   IMPRESSION: Distal esophageal masslike density measuring 3.7 cm, suspicious for primary esophageal carcinoma. Consider endoscopy for further evaluation, as well as PET-CT scan for staging.  Right paratracheal lymphadenopathy, consistent with metastatic disease. Shotty less than 1 cm lymph nodes in the gastrohepatic ligament and right supraclavicular region are also suspicious for early lymph node metastases.  Small bilateral pulmonary metastases, as described above.  Right lower lung airspace disease, suspicious for pneumonia or aspiration. Mild left basilar atelectasis or scarring.  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   Electronically Signed   By: Earle Gell M.D.   On: 02/27/2014 14:50    Scheduled Meds: . levofloxacin (LEVAQUIN)  750 mg Intravenous Q24H  . levothyroxine  88 mcg Oral QAC breakfast  . nicotine  21 mg Transdermal Daily  . pantoprazole  40 mg Intravenous Q24H   Continuous Infusions: . sodium chloride    . sodium chloride    . 0.9 % NaCl with KCl 20 mEq / L 75 mL/hr at 03/01/14 0320  . lactated ringers 20 mL/hr at 03/01/14 1022

## 2014-03-02 ENCOUNTER — Encounter (HOSPITAL_COMMUNITY): Payer: Self-pay | Admitting: Gastroenterology

## 2014-03-02 DIAGNOSIS — R634 Abnormal weight loss: Secondary | ICD-10-CM

## 2014-03-02 DIAGNOSIS — E43 Unspecified severe protein-calorie malnutrition: Secondary | ICD-10-CM | POA: Diagnosis present

## 2014-03-02 DIAGNOSIS — C7800 Secondary malignant neoplasm of unspecified lung: Secondary | ICD-10-CM

## 2014-03-02 DIAGNOSIS — F17200 Nicotine dependence, unspecified, uncomplicated: Secondary | ICD-10-CM

## 2014-03-02 DIAGNOSIS — C7970 Secondary malignant neoplasm of unspecified adrenal gland: Secondary | ICD-10-CM

## 2014-03-02 DIAGNOSIS — C155 Malignant neoplasm of lower third of esophagus: Principal | ICD-10-CM

## 2014-03-02 MED ORDER — BOOST / RESOURCE BREEZE PO LIQD
1.0000 | Freq: Three times a day (TID) | ORAL | Status: DC
  Administered 2014-03-02 – 2014-03-03 (×2): 1 via ORAL

## 2014-03-02 MED ORDER — SODIUM CHLORIDE 0.9 % IV SOLN
Freq: Once | INTRAVENOUS | Status: AC
  Administered 2014-03-02: 17:00:00 via INTRAVENOUS

## 2014-03-02 NOTE — Progress Notes (Signed)
INITIAL NUTRITION ASSESSMENT  Pt meets criteria for severe MALNUTRITION in the context of chronic illness as evidenced by <75% estimated energy intake with 7.8% weight loss in the past month per pt report.  DOCUMENTATION CODES Per approved criteria  -Severe malnutrition in the context of chronic illness   INTERVENTION: - Diet advancement per MD - Resource Breeze TID - Will continue to monitor   NUTRITION DIAGNOSIS: Inadequate oral intake related to clear liquid diet as evidenced by diet order.   Goal: Advance diet as tolerated to regular diet  Monitor:  Weights, labs, diet advancement  Reason for Assessment: Malnutrition screening tool, consult for poor intake   58 y.o. female  Admitting Dx: Right upper quadrant pain  ASSESSMENT: Pt with history of hypothyroidism and hyperlipidemia, who presented to [facility] ED 02/27/2014 with 3 week history of nausea, vomiting and right upper quadrant abdominal pain radiating to right shoulder and upper back. No reports of blood in stool or urine. Pt also reported occasional inconsistent dry cough. 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 She was transferred to [facility] for further evaluation and management. Patient underwent and upper endoscopy 03/01/2014 with a biopsy consistent with adenocarcinoma.  Met with pt who reports 13 pound unintended weight loss in the past month r/t dysphagia with solid foods. States the only things she could get down were baked potato, chicken broth, fish, and ice cream. If she tried to eat anything else, the food would get stuck in her throat and she would have to vomit it up. States  this occurred with every meal. Reports 23 pound unintended weight loss in the past year. Reports having a little nausea PTA but none today. Ate 100% of clear liquid meal for lunch. C/o right sided abdominal pain x 1 week. Not on any nutritional supplements at home.   Alk phos elevated   Nutrition Focused Physical Exam:  Subcutaneous Fat:  Orbital Region: WNL Upper Arm Region: WNL Thoracic and Lumbar Region: NA  Muscle:  Temple Region: WNL Clavicle Bone Region: WNL Clavicle and Acromion Bone Region: WNL Scapular Bone Region: NA Dorsal Hand: WNL Patellar Region: WNL Anterior Thigh Region: WNL Posterior Calf Region: WNL  Edema: None noted    Height: Ht Readings from Last 1 Encounters:  03/01/14 '5\' 4"'  (1.626 m)    Weight: Wt Readings from Last 1 Encounters:  03/01/14 154 lb 1.6 oz (69.9 kg)    Ideal Body Weight: 120 lb  % Ideal Body Weight: 128%  Wt Readings from Last 10 Encounters:  03/01/14 154 lb 1.6 oz (69.9 kg)  03/01/14 154 lb 1.6 oz (69.9 kg)  03/01/14 154 lb 1.6 oz (69.9 kg)  02/01/14 160 lb (72.576 kg)  11/03/12 173 lb 14.4 oz (78.881 kg)  10/13/12 174 lb (78.926 kg)    Usual Body Weight: 167 lb per pt  % Usual Body Weight: 92%  BMI:  Body mass index is 26.44 kg/(m^2).  Estimated Nutritional Needs: Kcal: 1650-1800 Protein: 70-85g Fluid: 1.6-1.8L/day  Skin: Intact   Diet Order: Clear Liquid  EDUCATION NEEDS: -No education needs identified at this time   Intake/Output Summary (Last 24 hours) at 03/02/14 1335 Last data filed at 03/02/14 0900  Gross per 24 hour  Intake   1650 ml  Output   2225 ml  Net   -575 ml    Last BM: 3/22  Labs:   Recent Labs Lab 02/27/14 1349 02/28/14 0644  NA 142 143  K 3.4* 3.8  CL 95* 101  CO2 39* 32  BUN 7 4*  CREATININE 0.67 0.69  CALCIUM 10.3 9.4  MG 2.3  --   GLUCOSE 116* 92    CBG (last 3)  No results found for this basename: GLUCAP,  in the last 72 hours  Scheduled Meds: . levofloxacin  (LEVAQUIN) IV  750 mg Intravenous Q24H  . levothyroxine  88 mcg Oral QAC breakfast  . nicotine  21 mg Transdermal Daily  . pantoprazole (PROTONIX) IV  40 mg Intravenous Q24H  . pneumococcal 23 valent vaccine  0.5 mL Intramuscular Tomorrow-1000    Continuous Infusions: . 0.9 % NaCl with KCl 20 mEq / L 75 mL/hr at 03/02/14 1610    Past Medical History  Diagnosis Date  . Hyperlipidemia   . Hypothyroidism   . Tobacco abuse     Past Surgical History  Procedure Laterality Date  . Abdominal hysterectomy  2000  . Cholecystectomy  1986  . Mandible surgery      lower and upper  . Cesarean section    . Dilation and curettage of uterus    . Esophagogastroduodenoscopy (egd) with propofol N/A 03/01/2014    Procedure: ESOPHAGOGASTRODUODENOSCOPY (EGD) WITH PROPOFOL;  Surgeon: Wonda Horner, MD;  Location: [facility] ENDOSCOPY;  Service: Endoscopy;  Laterality: N/A;    Mikey College MS, [place], [place] Pager [phone] After Hours Pager

## 2014-03-02 NOTE — Consult Note (Signed)
[place]  Telephone:(336) [place]   Elizabeth Moss  DOB: 02/15/1956  MR#: [id number]  CSN#: [id number]    Requesting Physician: [facility], Dr. Charlies Silvers  Primary MD: [name], MD    Reason for consultation: Esophageal Adenocarcinoma  History of present illness:      58 y.o.white female transferred from [facility] after presenting on 3/21 with 3-week history of nausea, vomiting and RUQ pain radiating to the R shoulder and upper back. A CT of the chest abdomen and pelvis was performed on 3/21, revealing a distal esophageal mass of 3.7 cm worrisome for malignancy plus high grade paratracheal and mediastinal adenopathy measuring 1.7 cm consistent with metastatic disease, along with tiny less than 1 cm right supraclavicular lymph nodes--new on the study done in November 2013. A 1.1 cm pulmonary nodule  is seen in the right middle lobe and a 6 mm nodule is seen in the central left lower lobe highly suspicious for metastases. A few other tiny scattered less than 5 mm right lung nodules were seen, nonspecific. Pneumonia versus aspiration was seen in the right lung base. No pleural effusion. Small lymph nodes were seen in the gastrohepatic ligament measuring up to 10 mm, and a 1.9 cm right adrenal mass, new findings, was seen, consistent with adrenal metastases. No suspicious bone lesions are identified.CT of the abdomen without contrast showed thickening of the distal esophagus concerning for esophageal carcinoma, and once again the right adrenal gland had suspicious mets; a 7 mm paraesophageal lymph node was noted. This, this normal. No retroperitoneal adenopathy, Once arriving at [facility], she underwent EGD on 03/01/14 .  Preliminary Results are consistent with Adenocarcinoma . No  tumor markers were obtained. No family history of GI cancer. Denies risk factors for HIV or hepatitis. Were kindly asked to see the patient in consultation  with recommendations regarding her care.  Past medical history:      Past Medical History  Diagnosis Date  . Hyperlipidemia   . Hypothyroidism diagnosed in 2012 requiring radioactive iodine treatment Dr. [name]    . Tobacco abuse        Chronic macrocytosis  Past surgical history:      Past Surgical History  Procedure Laterality Date  . Abdominal hysterectomy  2000  . Cholecystectomy  1986  . Mandible surgery      lower and upper requiring metal plates  . Cesarean section    . Dilation and curettage of uterus    . Esophagogastroduodenoscopy (egd) with propofol N/A 03/01/2014    Procedure: ESOPHAGOGASTRODUODENOSCOPY (EGD) WITH PROPOFOL;  Surgeon: Wonda Horner, MD;  Location: [facility] END[facility];  Service: Endoscopy;  Laterality: N/A;    Medications:  Prior to Admission:  Prescriptions prior to admission  Medication Sig Dispense Refill  . levothyroxine (SYNTHROID, LEVOTHROID) 88 MCG tablet Take 88 mcg by mouth daily before breakfast.      . aspirin 81 MG tablet Take 81 mg by mouth daily.      Marland Kitchen atorvastatin (LIPITOR) 40 MG tablet Take 20 mg by mouth daily.       . Omega-3 Fatty Acids (FISH OIL PO) Take 1 capsule by mouth daily.       . ondansetron (ZOFRAN-ODT) 4 MG disintegrating tablet Take 1 tablet by mouth as needed. nausea        UXN:ATFTDDUKGURKY, acetaminophen, albuterol, alum & mag hydroxide-simeth, morphine injection, ondansetron (ZOFRAN) IV, ondansetron  Allergies:  Allergies  Allergen Reactions  .  Iohexol Anaphylaxis     Desc: PATIENT GOES INTO ANAPHALATIC SHOCK AFTER INJECTION OF OMNIPAQUE, Onset Date: [id number]   . Tetracyclines & Related Rash    Family history:     Family History  Problem Relation Age of Onset  . Hyperlipidemia Mother   . Other Mother     varicose veins   She does not know her father. One half-sister with no medical issues.one half brother who died with brain cancer.                                         Social history:   Married for  38 years, 3  Children. Lives in [place]. Unemployed.smokesl half pack a day for at least 38 years. No alcohol or recreational drug use. Full code  Review of systems:  See HPI for significant positives.  Constitutional: Positive 13 pound unintended weight loss in the past month in the setting of "food getting stuck in the throat". Total of 23 lb weight loss in the past year. . Negative for fever,night sweats, or chills  Eyes: Negative for blurred vision and double vision.  Respiratory: Negative for cough or  hemoptysis Negative Positive for  shortness of breath.  Cardiovascular: Negative for chest pain. Negative for palpitations. GI:  As per HPI. No nausea, vomiting, diarrhea, or constipation. QI:[id number] for hematuria. No loss of urinary control. No Urinary retention.  Skin: Negative for itching. No rash. No petechia. No bruising.  Neurological: No headaches. No motor or sensory deficits.No confusion.     Physical exam:      Filed Vitals:   03/02/14 1200  BP:   Pulse:   Temp: 97.8 F (36.6 C)  Resp:      Body mass index is 26.44 kg/(m^2).   General: 58 y.o. female  in no acute distress A. and O. x3  well-developed and well-nourished.  HEENT: Normocephalic, atraumatic, PERRLA. Oral cavity without thrush or lesions.alopecia noted, as well as mild hirsutism Neck supple. No apparent thyromegaly, no cervical or supraclavicular adenopathy  Lungs clear bilaterally . No wheezing, rhonchi or rales. No axillary masses. Breasts: not examined. Cardiac regular rate and rhythm normal S1-S2, no murmur , rubs or gallops Abdomen soft nontender , bowel sounds x4. No HSM. No masses palpable.  GU/rectal: deferred. Extremities no clubbing, no  cyanosis or edema. No bruising or petechial rash Musculoskeletal: no spinal tenderness.  Neuro: Non Focal   Lab results:       CBC  Recent Labs Lab 02/27/14 1349 02/28/14 0644  WBC 6.7 4.7  HGB 16.2* 13.7  HCT 49.5* 42.9  PLT 195 187  MCV  110.2* 109.2*  MCH 36.1* 34.9*  MCHC 32.7 31.9  RDW 14.6 14.5  LYMPHSABS 0.9  --   MONOABS 0.5  --   EOSABS 0.1  --   BASOSABS 0.0  --     Anemia panel:   Recent Labs  02/28/14 0644  VITAMINB12 1215*     Chemistries   Recent Labs Lab 02/27/14 1349 02/28/14 0644  NA 142 143  K 3.4* 3.8  CL 95* 101  CO2 39* 32  GLUCOSE 116* 92  BUN 7 4*  CREATININE 0.67 0.69  CALCIUM 10.3 9.4  MG 2.3  --      Coagulation profile No results found for this basename: INR, PROTIME,  in the last 168 hours  Urine Studies No results found for  this basename: UACOL, UAPR, USPG, UPH, UTP, UGL, UKET, UBIL, UHGB, UNIT, UROB, ULEU, UEPI, UWBC, URBC, UBAC, CAST, CRYS, UCOM, BILUA,  in the last 72 hours  Studies:      Ct Abdomen Wo Contrast  02/26/2014   CLINICAL DATA:  Right upper quadrant pain  EXAM: CT ABDOMEN WITHOUT CONTRAST  TECHNIQUE: Multidetector CT imaging of the abdomen was performed following the standard protocol without IV contrast.  COMPARISON:  DG ABD ACUTE W/CHEST dated 02/01/2014  FINDINGS: There is a branching linear nodular pattern within the right lower lobe within air bronchograms (image number 5 and 6). This is new from prior.  No focal hepatic lesion on this noncontrast exam. Post cholecystectomy. The pancreas, spleen, and kidneys are unremarkable on this noncontrast exam. There is an exophytic low-density round lesion extending from the left kidney which likely represent benign cysts.  The right adrenal gland is enlarged measuring 17 mm (image 16). This enlargement of appears new from comparison chest CT 11/03/2012 and is new from CT of 06/25/2008.  There is circumferential thickening of the distal esophagus approximately 4 cm above the gastroesophageal junction. Single wall thickness measures 17 mm. 7 mm paraesophageal lymph node (image 5).  The stomach is normal. Limited view of the small bowel is unremarkable. No retroperitoneal adenopathy. Small gastrohepatic ligament lymph  nodes measure 10 mm.  IMPRESSION: 1. Thickening of the distal esophagus is concerning for esophageal carcinoma. 2. New enlargement of the right adrenal gland is concerning for metastasis. 3. Linear nodular branching pattern in the right lower lobe is nonspecific and could represent infection or neoplasm. 4. Recommend GI consultation and endoscopy to evaluate esophagus. Patient may benefit from FDG PET scan for further evaluation of these concerning findings. These results will be called to the ordering clinician or representative by the Radiologist Assistant, and communication documented in the PACS Dashboard.   Electronically Signed   By: Suzy Bouchard M.D.   On: 02/26/2014 18:13   Dg Chest 2 View  02/26/2014   CLINICAL DATA:  Right lower chest pain, history of tobacco use.  EXAM: CHEST  2 VIEW  COMPARISON:  DG ABD ACUTE W/CHEST dated 02/01/2014  FINDINGS: There is increased density anteriorly in the right lower lobe consistent with pneumonia. There is subsegmental atelectasis in the lingula. The left lower lobe and both upper lobes exhibit no infiltrates or atelectasis. Stable mildly increased interstitial markings are present in both lungs. The cardiac silhouette is normal in size. The pulmonary vascularity is not engorged. There is mild tortuosity of the descending thoracic aorta. There is no pleural effusion. The observed portions of the bony thorax exhibit no acute abnormalities.  IMPRESSION: 1. There is patchy anterior right lower lobe pneumonia and there is lingular atelectasis on the left. 2. Mildly increased interstitial markings are present bilaterally consistent with the patient's smoking history. 3. There is no evidence of CHF nor pleural effusion.   Electronically Signed   By: Estephania Licciardi  [place]   On: 02/26/2014 16:15   Ct Chest Wo Contrast  02/27/2014   CLINICAL DATA:  Chest pain. Bibasilar infiltrates. Severe contrast allergy.  EXAM: CT CHEST WITHOUT CONTRAST  TECHNIQUE: Multidetector CT imaging  of the chest was performed following the standard protocol without IV contrast.  COMPARISON:  11/03/2012  FINDINGS: Fluid-filled upper thoracic esophagus is seen with a masslike density in the lower thoracic esophagus measuring 2.8 x 3.7 cm on image 32. This is suspicious for esophageal carcinoma. New high right paratracheal mediastinal adenopathy is seen measuring  1.7 cm on image 8, consistent with metastatic disease and there also tiny less than 1 cm right supraclavicular lymph nodes, which are new since previous study.  An 11 mm pulmonary nodule is seen in the right middle lobe on image 29 and a 6 mm nodule is seen in central left lower lobe on image 33, highly suspicious for pulmonary metastases. A few other tiny scattered less than 5 mm right lung nodules are seen which are nonspecific on images 11 and 21.  Patchy airspace opacity is seen in the right lung base, suspicious for pneumonia or aspiration. Mild atelectasis or scarring noted in the left lung base. No evidence of pleural effusion.  Small lymph nodes are seen in the gastric patent ligament measuring up to 9 mm, suspicious for early lymph node metastases. A 1.9 cm right adrenal mass is also seen which is new and consistent with adrenal metastasis. No suspicious bone lesions identified  IMPRESSION: Distal esophageal masslike density measuring 3.7 cm, suspicious for primary esophageal carcinoma. Consider endoscopy for further evaluation, as well as PET-CT scan for staging.  Right paratracheal lymphadenopathy, consistent with metastatic disease. Shotty less than 1 cm lymph nodes in the gastrohepatic ligament and right supraclavicular region are also suspicious for early lymph node metastases.  Small bilateral pulmonary metastases, as described above.  Right lower lung airspace disease, suspicious for pneumonia or aspiration. Mild left basilar atelectasis or scarring.  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   Electronically  Signed   By: Earle Gell M.D.   On: 02/27/2014 14:50   Dg Abd Acute W/chest  02/01/2014   CLINICAL DATA:  Diffuse abdominal pain; difficulty urinating. Constipation.  EXAM: ACUTE ABDOMEN SERIES (ABDOMEN 2 VIEW & CHEST 1 VIEW)  COMPARISON:  CT CHEST W/O CM dated 11/03/2012; DG CHEST 2 VIEW dated 09/12/2012  FINDINGS: The lungs are well-aerated. Mild bibasilar opacities likely reflect atelectasis. There is no evidence of pleural effusion or pneumothorax. The cardiomediastinal silhouette is within normal limits.  The visualized bowel gas pattern is unremarkable. Scattered stool and air are seen within the colon; there is no evidence of small bowel dilatation to suggest obstruction. No free intra-abdominal air is identified on the provided upright view. Clips are noted within the right upper quadrant, reflecting prior cholecystectomy.  No acute osseous abnormalities are seen; the sacroiliac joints are unremarkable in appearance.  IMPRESSION: 1. Unremarkable bowel gas pattern; no free intra-abdominal air seen. Small amount of stool noted in the colon, without significant radiographic evidence of constipation. 2. Mild bibasilar airspace opacities likely reflect atelectasis.   Electronically Signed   By: Garald Balding M.D.   On: 02/01/2014 04:43   PATHOLOGY: Diagnosis Esophagus, biopsy, distal - ADENOCARCINOMA. Microscopic Comment Dr. Tresa Moore agrees. Called to Dr. Penelope Coop on 03/02/14. (JDP:gt, 03/02/14) Claudette Laws MD Pathologist, Electronic Signature (Case signed 03/02/2014)  Assessmnent/Plan:57 y.o. 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  We were requested to see the patient with recommendations. Dr. Juliann Mule   is to see the patient following this consult with recommendations regarding diagnosis, treatment options and further  workup studies.  To patient and her husband are interested in having continuation of care in [place] when discharged from the hospital. An addendum to this note is to be written. Thank you for the referral.   Rondel Jumbo, PA-C 03/02/2014   ADDENDUM:  Patient seen and examined by me. 58 year old female transferred from [facility] secondary to a 3-week history of nausea, vomiting and RUQ pain, CT revealed esophageal mass, now s/p EGD with pathology confirming distal esophogeal adenocarcinoma concerning for lung, and adrenal gland metastases. ECOG 1. Positive weight loss.    Patient aware of pathology and imaging.  She denies kidney or liver dysfunction or difficulty hearing.  She request her care to be done locally.  We had an extensive discussion regarding her pathology and probable metastatic disease.  We discussed options including chemotherapy and palliative XRT.    Recommendations: 1. Please send tumor tissue collected for additional HER2 testing. Fax results to local oncologist. 2. Please arrange one-week local follow up with Dr. Gaston Islam. [facility] of [place], [place] (at patient's request) or one of the [facility] oncology affiliates.   3. Likely will require PET as an outpatient.   4. Radiation oncology consult as an outpatient 5. Tobacco cessation  I personally saw this patient and performed a substantive portion of this encounter with the listed APP documented above.   Nashali Ditmer, MD

## 2014-03-02 NOTE — Progress Notes (Signed)
[facility] PROGRESS NOTE  NEERA TENG JIR:[id number] DOB: Apr 12, 1956 DOA: 02/27/2014 PCP: [name], MD  Brief narrative: 58 y.o. female with a history of hypothyroidism and hyperlipidemia, who presented to [facility] 02/27/2014 with 3 week history of nausea, vomiting and right upper quadrant abdominal pain radiating to right shoulder and upper back. No reports of blood in stool or urine. Pt also reported occasional inconsistent dry cough. 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 She was transferred to [facility] for further evaluation and management. Patient underwent and upper endoscopy 03/01/2014 with a biopsy consistent with adenocarcinoma. Patient is also on Levaquin for treatment of pneumonia.  Assessment/Plan:   Principal Problem:  Right upper quadrant pain, nausea and vomiting in the setting of newly diagnosed esophageal adenocarcionoma   Likely related to esophageal mass seen on CT chest. Patient had upper endoscopy on 03/01/2014 with biopsy results consistent with adenocarcinoma. I spoke with Dr. Juliann Mule of oncology who will assist [place] in further management of this new finding of esophageal carcinoma. Pain is little bit better this morning. Pain management is with morphine 4 mg every 3 hours IV PRN. Patient is on PPI therapy once a day.  Please note that post endoscopy on 03/01/2014 patient had higher oxygen requirements and needed to be transferred to step down unit. Patient is stable this morning for transfer to medical floor. Active Problems:  CAP (community acquired pneumonia)  Continue Levaquin daily. Blood cultures to date are negative.   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 Will follow up with oncology and their recommendations.   Severe protein calorie malnutrition Poor by mouth intake secondary to likely esophageal carcinoma. Nutrition consulted 03/02/2014. Hypothyroidism  Continue levothyroxine.   Code Status: full code  Family Communication: no family at the bedside  Disposition Plan: home when stable   Consultants:  GI (Dr. Penelope Coop) Oncology (Dr. Juliann Mule)  Procedures:  Endoscopy 03/01/2014  Antibiotics:  Levaquin 02/27/2014 -->  Leisa Lenz, MD  [facility] Pager [phone]  If 7PM-7AM, please contact night-coverage [email or web address] Password [facility] 03/02/2014, 12:44 PM   LOS: 3 days   HPI/Subjective: No overnight events.  Objective: Filed Vitals:   03/02/14 0400 03/02/14 0800 03/02/14 0900 03/02/14 1200  BP: 101/76  112/65   Pulse: 72 71 72   Temp: 98.3 F (36.8 C) 98.1 F (36.7 C)  97.8 F (36.6 C)  TempSrc: Oral Oral  Oral  Resp: 18 28 32   Height:      Weight:      SpO2: 99% 97% 98%     Intake/Output Summary (Last 24 hours) at 03/02/14 1244 Last data filed at 03/02/14 0900  Gross per 24 hour  Intake   1950 ml  Output   2225 ml  Net   -275 ml    Exam:   General:  Pt is sleeping this am, not in acute distress  Cardiovascular: Regular rate and rhythm, S1/S2 appreciated   Respiratory: Clear to auscultation bilaterally, no wheezing, no crackles, no rhonchi  Abdomen: Soft, non tender, non distended, bowel sounds present, no guarding  Extremities: No edema, pulses DP and PT palpable bilaterally  Neuro: Grossly nonfocal  Data Reviewed: Basic Metabolic Panel:  Recent Labs  Lab 02/27/14 1349 02/28/14 0644  NA 142 143  K 3.4* 3.8  CL 95* 101  CO2 39* 32  GLUCOSE 116* 92  BUN 7 4*  CREATININE 0.67 0.69  CALCIUM 10.3 9.4  MG 2.3  --    Liver Function Tests:  Recent Labs Lab 02/27/14 1349 02/28/14 0644  AST 23 17  ALT 28 20   ALKPHOS 182* 133*  BILITOT 0.9 0.7  PROT 7.4 5.6*  ALBUMIN 3.0* 2.4*   No results found for this basename: LIPASE, AMYLASE,  in the last 168 hours No results found for this basename: AMMONIA,  in the last 168 hours CBC:  Recent Labs Lab 02/27/14 1349 02/28/14 0644  WBC 6.7 4.7  NEUTROABS 5.2  --   HGB 16.2* 13.7  HCT 49.5* 42.9  MCV 110.2* 109.2*  PLT 195 187   Cardiac Enzymes: No results found for this basename: CKTOTAL, CKMB, CKMBINDEX, TROPONINI,  in the last 168 hours BNP: No components found with this basename: POCBNP,  CBG: No results found for this basename: GLUCAP,  in the last 168 hours  CULTURE, BLOOD (ROUTINE X 2)     Status: None   Collection Time    02/27/14  1:50 PM      Result Value Ref Range Status   Specimen Description LEFT ANTECUBITAL   Final   Report Status PENDING   Incomplete  CULTURE, BLOOD (ROUTINE X 2)     Status: None   Collection Time    02/27/14  1:52 PM      Result Value Ref Range Status   Specimen Description RIGHT ANTECUBITAL   Final   Report Status PENDING   Incomplete  MRSA PCR SCREENING     Status: None   Collection Time    03/01/14  2:50 PM      Result Value Ref Range Status   MRSA by PCR NEGATIVE  NEGATIVE Final     Studies: No results found.  Scheduled Meds: . levofloxacin (LEVAQUIN)   750 mg Intravenous Q24H  . levothyroxine  88 mcg Oral QAC breakfast  . nicotine  21 mg Transdermal Daily  . pantoprazole (PROTONIX) IV  40 mg Intravenous Q24H   Continuous Infusions: . 0.9 % NaCl with KCl 20 mEq / L 75 mL/hr at 03/02/14 0636

## 2014-03-02 NOTE — Progress Notes (Signed)
The patient is doing well today after her EGD. I spoke with the pathologist and was informed that the biopsies of the esophageal mass are compatible with adenocarcinoma. The patient was informed. I would recommend an oncology consult. We will sign off. If we can be of any further assistance let us know.

## 2014-03-03 DIAGNOSIS — E43 Unspecified severe protein-calorie malnutrition: Secondary | ICD-10-CM

## 2014-03-03 DIAGNOSIS — C159 Malignant neoplasm of esophagus, unspecified: Secondary | ICD-10-CM | POA: Diagnosis present

## 2014-03-03 NOTE — Discharge Instructions (Signed)
Esophageal Cancer Esophageal cancer occurs when abnormal cells within the esophagus begin to divide rapidly and uncontrollably. The esophagus is the tube that carries food and drink from the throat into the stomach.  CAUSES  The exact cause of esophageal cancer is not known. It is believed that esophageal cancer occurs due to many factors. People are at a higher risk of developing esophageal cancer if they:  Are older than 57 years of age.  Are female.  Smoke or use tobacco.  Drink heavily.  Eat a poor diet (low in fruits and vegetables).  Are overweight (obese).  Have conditions which result in long-standing damage or irritation to the esophagus. These conditions include:  Acid reflux (the stomach acid leaks back up the esophagus, damaging it).  Barrett Esophagus (abnormal cells are found in the lower part of the esophagus, usually due to acid reflux occurring over a long period of time).  Achalasia (the muscles and nerves of the esophagus do not work properly. Food and drink do not move in a normal way down the esophagus and into the stomach).  Esophageal webs (thin strings of tissues grow within the esophagus).  Damage due to toxic exposures (an example would be swallowing a caustic poison such as lye). SYMPTOMS  Symptoms can include:  Trouble swallowing.  Chest or back pain.  Unintentional weight loss.  Severe tiredness (fatigue).  Hoarse voice.  Cough. DIAGNOSIS  Esophageal cancer is usually diagnosed by performing:  Barium swallow. After drinking barium (a liquid that coats the esophagus), a series of X-rays are taken which can reveal abnormalities.  Endoscopic exam. A lighted scope is used to view the inside of the esophagus.  Biopsy. Small pieces of the esophagus are removed for exam in a lab. This can be done through the scope used for an endoscopic exam. TREATMENT  Treatment of esophageal cancer depends on a number of factors, including:   Characteristics  of the cancer cells present.  Tumor size.  Whether the cancer has spread to lymph nodes or to other more distant locations (such as other organs or bone). The types of treatments used for esophageal cancer include:  Surgery to remove as much of the cancer as possible.  Chemotherapy (medicines that kill cancer cells).  Radiation therapy to kill cancer cells.  Combinations of chemotherapy and radiation therapy.  Biological therapy that uses antibodies in ways that can take advantage of the weaknesses of the tumor cells. Your caregivers will also address your needs for pain relief, nutrition, and help with swallowing problems if these develop.  HOME CARE INSTRUCTIONS   Only take over-the-counter or prescription medicines for pain, discomfort or fever as directed by your caregiver.  Maintain a healthy diet. Advice from a nutritionist can be helpful when addressing your specific needs.  Consider joining a support group. This may help you learn to cope with the stress of having esophageal cancer.  Seek advice to help you manage treatment side effects. SEEK MEDICAL CARE IF:  You develop problems with swallowing that are getting worse.  You notice new fatigue or weakness.  You experience unintentional weight loss. SEEK IMMEDIATE MEDICAL CARE IF:  You have a sudden increase in pain.  You have trouble breathing.  You have a fever.  You vomit blood or black material that looks like coffee grounds.  You faint. Document Released: 11/08/2008 Document Revised: 02/18/2012 Document Reviewed: 11/08/2008 [facility] Patient Information 2014 [place].

## 2014-03-03 NOTE — Discharge Summary (Signed)
Physician Discharge Summary  Elizabeth Moss FBP:[id number] DOB: November 20, 1956 DOA: 02/27/2014  PCP: [name], MD  Admit date: 02/27/2014 Discharge date: 03/03/2014  Recommendations for Outpatient Follow-up:  1. Patient to followup with cancer Center in [place]. 2. Followup final blood culture results.  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  Diet recommendation: Low-sodium, heart healthy as tolerated.  History of present illness:  Elizabeth Moss is an 58 y.o. female who was transferred from [facility] to [facility] 02/28/14 after presenting there with a 3 week history of nausea, vomiting and right upper quadrant pain radiating to the right shoulder and upper back. She underwent CT scan of the chest abdomen and pelvis on 02/27/14 which revealed a distal esophageal mass of 3.7 cm worrisome for malignancy as well as mediastinal and paratracheal lymphadenopathy. Patient underwent EGD 03/01/14 with biopsies confirming adenocarcinoma. She is also being treated with Levaquin for pneumonia.  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 Underwent EGD 03/01/14 with biopsy confirming adenocarcinoma. Oncology consultation performed by Dr. Juliann Mule 03/02/14 with recommendations for further diagnostic testing: Biopsy specimens, and recommendations  for outpatient PET scanning and radiation oncology consultation. He recommends followup with Dr. Tressie Stalker in [place].  Active Problems:  CAP (community acquired pneumonia)  Had an episode of hypoxia post EGD. Stable on Levaquin (completed 5 days of therapy). Blood cultures negative to date.  Tobacco abuse  Counseled on cessation. Continue nicotine patch.  Poor dentition  Recommend outpatient dental consultation.  Metabolic alkalosis  Resolved.  Hypothyroidism  Continue Synthroid. TSH slightly low.  Macrocytosis  B12 level 1215.  Dysphagia  Secondary to esophageal mass.  Protein-calorie malnutrition, severe  Secondary to malignancy-induced dysphasia and possible cancer related cachexia. Seen by dietitian 03/02/14. Continue resource breeze supplements.  Consultants:  GI (Dr. Penelope Coop)  Oncology (Dr. Juliann Mule)  Procedures:  Endoscopy 03/01/2014    Discharge Exam: Filed Vitals:   03/03/14 0416  BP: 105/57  Pulse: 72  Temp: 97.7 F (36.5 C)  Resp: 16   Filed Vitals:   03/02/14 1619 03/02/14 1700 03/02/14 2120 03/03/14 0416  BP: 85/42 86/46 96/50  105/57  Pulse: 77 78 70 72  Temp: 97.4 F (36.3 C)  98.1 F (36.7 C) 97.7 F (36.5 C)  TempSrc: Oral  Oral Oral  Resp: 16 16 15 16   Height: 5\' 4"  (1.626 m)     Weight: 69.6 kg (153 lb 7 oz)     SpO2: 100%  98% 94%    Gen:  NAD Cardiovascular:  RRR, No M/R/G Respiratory: Lungs CTAB Gastrointestinal: Abdomen soft, NT/ND with normal active bowel sounds. Extremities: No C/E/C   Discharge Instructions  Discharge Orders   Future Orders Complete By Expires   Call MD for:  persistant nausea and vomiting  As directed    Call MD for:  severe uncontrolled pain  As directed  Call MD for:  temperature >100.4  As directed    Call MD for:  As directed    Scheduling Instructions:     Inability to swallow.   Diet - low sodium heart healthy  As directed    Discharge instructions  As directed    Comments:     You were cared for by  Dr. Jacquelynn Cree  (a hospitalist) during your hospital stay. If you have any questions about your discharge medications or the care you received while you were in the hospital after you are discharged, you can call the unit and ask to speak with the hospitalist on call if the hospitalist that took care of you is not available. Once you are discharged, your primary care physician will handle any further medical issues. Please note that NO REFILLS for any discharge medications will be authorized once you are discharged, as it is imperative that you return to your primary care physician (or establish a relationship with a primary care physician if you do not have one) for your aftercare needs so that they can reassess your need for medications and monitor your lab values.  Any outstanding tests can be reviewed by your PCP at your follow up visit.  It is also important to review any medicine changes with your PCP.  Please bring these d/c instructions with you to your next visit so your physician can review these changes with you.  If you do not have a primary care physician, you can call [phone] for a physician referral.  It is highly recommended that you obtain a PCP for hospital follow up.   Increase activity slowly  As directed        Medication List         aspirin 81 MG tablet  Take 81 mg by mouth daily.     atorvastatin 40 MG tablet  Commonly known as:  LIPITOR  Take 20 mg by mouth daily.     FISH OIL PO  Take 1 capsule by mouth daily.     levothyroxine 88 MCG tablet  Commonly known as:  SYNTHROID, LEVOTHROID  Take 88 mcg by mouth daily before breakfast.     ondansetron 4 MG disintegrating tablet  Commonly known as:  ZOFRAN-ODT  Take 1 tablet by mouth as needed. nausea           Follow-up Information   Follow up with [name], MD. Schedule an appointment as soon as possible for a visit in 2 weeks. [facility] follow up.)    Specialty:  Family Medicine   Contact  information:   [id number] [place] [address] [phone]       Follow up with [facility]. (Dr. Juliann Mule will arrange follow up.)    Contact information:   [address] [place] [address]        The results of significant diagnostics from this hospitalization (including imaging, microbiology, ancillary and laboratory) are listed below for reference.    Significant Diagnostic Studies: Ct Abdomen Wo Contrast  2014/03/11   CLINICAL DATA:  Right upper quadrant pain  EXAM: CT ABDOMEN WITHOUT CONTRAST  TECHNIQUE: Multidetector CT imaging of the abdomen was performed following the standard protocol without IV contrast.  COMPARISON:  DG ABD ACUTE W/CHEST dated 02/01/2014  FINDINGS: There is a branching linear nodular pattern within the right lower lobe within air bronchograms (image number 5 and 6). This is new from prior.  No focal hepatic lesion on this noncontrast exam. Post cholecystectomy. The pancreas, spleen, and kidneys are unremarkable on this noncontrast exam. There is an exophytic low-density round lesion extending from the left kidney which likely represent benign cysts.  The right adrenal gland is enlarged measuring 17 mm (image 16). This enlargement of appears new from comparison chest CT 11/03/2012 and is new from CT of 06/25/2008.  There is circumferential thickening of the distal esophagus approximately 4 cm above the gastroesophageal junction. Single wall thickness measures 17 mm. 7 mm paraesophageal lymph node (image 5).  The stomach is normal. Limited view of the small bowel is unremarkable. No retroperitoneal adenopathy. Small gastrohepatic ligament lymph nodes measure 10 mm.  IMPRESSION: 1. Thickening of the distal esophagus is concerning for esophageal carcinoma. 2. New enlargement of the right adrenal gland is concerning for metastasis. 3. Linear nodular branching pattern in the right lower lobe is nonspecific and could  represent infection or neoplasm. 4. Recommend GI consultation and endoscopy to evaluate esophagus. Patient may benefit from FDG PET scan for further evaluation of these concerning findings. These results will be called to the ordering clinician or representative by the Radiologist Assistant, and communication documented in the PACS Dashboard.   Electronically Signed   By: Suzy Bouchard M.D.   On: 02/26/2014 18:13   Dg Chest 2 View  02/26/2014   CLINICAL DATA:  Right lower chest pain, history of tobacco use.  EXAM: CHEST  2 VIEW  COMPARISON:  DG ABD ACUTE W/CHEST dated 02/01/2014  FINDINGS: There is increased density anteriorly in the right lower lobe consistent with pneumonia. There is subsegmental atelectasis in the lingula. The left lower lobe and both upper lobes exhibit no infiltrates or atelectasis. Stable mildly increased interstitial markings are present in both lungs. The cardiac silhouette is normal in size. The pulmonary vascularity is not engorged. There is mild tortuosity of the descending thoracic aorta. There is no pleural effusion. The observed portions of the bony thorax exhibit no acute abnormalities.  IMPRESSION: 1. There is patchy anterior right lower lobe pneumonia and there is lingular atelectasis on the left. 2. Mildly increased interstitial markings are present bilaterally consistent with the patient's smoking history. 3. There is no evidence of CHF nor pleural effusion.   Electronically Signed   By: David  Martinique   On: 02/26/2014 16:15   Ct Chest Wo Contrast  02/27/2014   CLINICAL DATA:  Chest pain. Bibasilar infiltrates. Severe contrast allergy.  EXAM: CT CHEST WITHOUT CONTRAST  TECHNIQUE: Multidetector CT imaging of the chest was performed following the standard protocol without IV contrast.  COMPARISON:  11/03/2012  FINDINGS: Fluid-filled upper thoracic esophagus is seen with a masslike density in the lower thoracic esophagus measuring 2.8 x 3.7 cm on image 32. This is suspicious  for esophageal carcinoma. New high right paratracheal mediastinal adenopathy is seen measuring 1.7 cm on image 8, consistent with metastatic disease and there also tiny less than 1 cm right supraclavicular lymph nodes, which are new since previous study.  An 11 mm pulmonary nodule is seen in the right middle lobe on image 29 and a 6 mm nodule is seen in central left lower lobe on image 33, highly suspicious for pulmonary metastases. A few other tiny scattered less than 5 mm right lung nodules are seen which are nonspecific on images 11 and 21.  Patchy airspace opacity is seen in the right lung base, suspicious for pneumonia or aspiration. Mild atelectasis or scarring noted in the left lung  base. No evidence of pleural effusion.  Small lymph nodes are seen in the gastric patent ligament measuring up to 9 mm, suspicious for early lymph node metastases. A 1.9 cm right adrenal mass is also seen which is new and consistent with adrenal metastasis. No suspicious bone lesions identified  IMPRESSION: Distal esophageal masslike density measuring 3.7 cm, suspicious for primary esophageal carcinoma. Consider endoscopy for further evaluation, as well as PET-CT scan for staging.  Right paratracheal lymphadenopathy, consistent with metastatic disease. Shotty less than 1 cm lymph nodes in the gastrohepatic ligament and right supraclavicular region are also suspicious for early lymph node metastases.  Small bilateral pulmonary metastases, as described above.  Right lower lung airspace disease, suspicious for pneumonia or aspiration. Mild left basilar atelectasis or scarring.  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   Electronically Signed   By: Earle Gell M.D.   On: 02/27/2014 14:50    Labs:  Basic Metabolic Panel:  Recent Labs Lab 02/27/14 1349 02/28/14 0644  NA 142 143  K 3.4* 3.8  CL 95* 101  CO2 39* 32  GLUCOSE 116* 92  BUN 7 4*  CREATININE 0.67 0.69  CALCIUM 10.3 9.4  MG 2.3  --      GFR Estimated Creatinine Clearance: 75.2 ml/min (by C-G formula based on Cr of 0.69). Liver Function Tests:  Recent Labs Lab 02/27/14 1349 02/28/14 0644  AST 23 17  ALT 28 20  ALKPHOS 182* 133*  BILITOT 0.9 0.7  PROT 7.4 5.6*  ALBUMIN 3.0* 2.4*   CBC:  Recent Labs Lab 02/27/14 1349 02/28/14 0644  WBC 6.7 4.7  NEUTROABS 5.2  --   HGB 16.2* 13.7  HCT 49.5* 42.9  MCV 110.2* 109.2*  PLT 195 187   Microbiology Recent Results (from the past 240 hour(s))  CULTURE, BLOOD (ROUTINE X 2)     Status: None   Collection Time    02/27/14  1:50 PM      Result Value Ref Range Status   Specimen Description LEFT ANTECUBITAL   Final   Special Requests BOTTLES DRAWN AEROBIC AND ANAEROBIC 10CC   Final   Culture NO GROWTH 4 DAYS   Final   Report Status PENDING   Incomplete  CULTURE, BLOOD (ROUTINE X 2)     Status: None   Collection Time    02/27/14  1:52 PM      Result Value Ref Range Status   Specimen Description RIGHT ANTECUBITAL   Final   Special Requests BOTTLES DRAWN AEROBIC AND ANAEROBIC 8CC   Final   Culture NO GROWTH 4 DAYS   Final   Report Status PENDING   Incomplete  MRSA PCR SCREENING     Status: None   Collection Time    03/01/14  2:50 PM      Result Value Ref Range Status   MRSA by PCR NEGATIVE  NEGATIVE Final   Comment:            The GeneXpert MRSA Assay (FDA     approved for NASAL specimens     only), is one component of a     comprehensive MRSA colonization     surveillance program. It is not     intended to diagnose MRSA     infection nor to guide or     monitor treatment for     MRSA infections.    Time coordinating discharge: 35 minutes.  Signed:  Traxton Kolenda  Pager [phone] [facility] 03/03/2014,  4:10 PM

## 2014-03-04 LAB — CULTURE, BLOOD (ROUTINE X 2)
Culture: NO GROWTH
Culture: NO GROWTH

## 2014-03-08 ENCOUNTER — Telehealth (HOSPITAL_COMMUNITY): Payer: Self-pay | Admitting: *Deleted

## 2014-03-08 NOTE — Telephone Encounter (Signed)
Patient scheduled for PET on April 6 @ 1pm. To arrive @ 12:45. To see Dr. Barnet Glasgow on April 8 @ 3:30. Patient instructed on PET scan instructions (no gum, hard candy, sugarfree candy, food or drink 6 hours prior to PET). Patient instructed not to do any of the previously mentioned past 7am that morning. Patient verbalized understanding of all instructions regarding location of PET scan, instructions for PET scan, and appt date and time for Dr. Barnet Glasgow.

## 2014-03-15 ENCOUNTER — Ambulatory Visit (HOSPITAL_COMMUNITY)
Admission: RE | Admit: 2014-03-15 | Discharge: 2014-03-15 | Disposition: A | Discharge status: Home / Self Care | Payer: 59 | Source: Ambulatory Visit | Attending: Hematology and Oncology | Admitting: Hematology and Oncology

## 2014-03-15 DIAGNOSIS — C7970 Secondary malignant neoplasm of unspecified adrenal gland: Secondary | ICD-10-CM | POA: Insufficient documentation

## 2014-03-15 DIAGNOSIS — C159 Malignant neoplasm of esophagus, unspecified: Secondary | ICD-10-CM | POA: Insufficient documentation

## 2014-03-15 DIAGNOSIS — R918 Other nonspecific abnormal finding of lung field: Secondary | ICD-10-CM | POA: Insufficient documentation

## 2014-03-15 DIAGNOSIS — C779 Secondary and unspecified malignant neoplasm of lymph node, unspecified: Secondary | ICD-10-CM

## 2014-03-15 DIAGNOSIS — C50919 Malignant neoplasm of unspecified site of unspecified female breast: Secondary | ICD-10-CM | POA: Insufficient documentation

## 2014-03-15 LAB — GLUCOSE, CAPILLARY: Glucose-Capillary: 81 mg/dL (ref 70–99)

## 2014-03-15 MED ORDER — FLUDEOXYGLUCOSE F - 18 (FDG) INJECTION
7.7000 | Freq: Once | INTRAVENOUS | Status: AC | PRN
  Administered 2014-03-15: 7.7 via INTRAVENOUS

## 2014-03-17 ENCOUNTER — Encounter (HOSPITAL_COMMUNITY): Payer: Self-pay

## 2014-03-17 ENCOUNTER — Encounter (HOSPITAL_COMMUNITY): Payer: 59 | Attending: Hematology and Oncology

## 2014-03-17 VITALS — BP 119/71 | HR 94 | Temp 97.8°F | Resp 18 | Wt 147.7 lb

## 2014-03-17 DIAGNOSIS — C7970 Secondary malignant neoplasm of unspecified adrenal gland: Secondary | ICD-10-CM | POA: Insufficient documentation

## 2014-03-17 DIAGNOSIS — C779 Secondary and unspecified malignant neoplasm of lymph node, unspecified: Secondary | ICD-10-CM

## 2014-03-17 DIAGNOSIS — C778 Secondary and unspecified malignant neoplasm of lymph nodes of multiple regions: Secondary | ICD-10-CM

## 2014-03-17 DIAGNOSIS — E039 Hypothyroidism, unspecified: Secondary | ICD-10-CM | POA: Insufficient documentation

## 2014-03-17 DIAGNOSIS — C155 Malignant neoplasm of lower third of esophagus: Secondary | ICD-10-CM

## 2014-03-17 DIAGNOSIS — F17200 Nicotine dependence, unspecified, uncomplicated: Secondary | ICD-10-CM | POA: Insufficient documentation

## 2014-03-17 DIAGNOSIS — C771 Secondary and unspecified malignant neoplasm of intrathoracic lymph nodes: Secondary | ICD-10-CM | POA: Insufficient documentation

## 2014-03-17 DIAGNOSIS — R918 Other nonspecific abnormal finding of lung field: Secondary | ICD-10-CM | POA: Insufficient documentation

## 2014-03-17 DIAGNOSIS — G510 Bell's palsy: Secondary | ICD-10-CM | POA: Insufficient documentation

## 2014-03-17 DIAGNOSIS — C154 Malignant neoplasm of middle third of esophagus: Secondary | ICD-10-CM | POA: Insufficient documentation

## 2014-03-17 DIAGNOSIS — C7800 Secondary malignant neoplasm of unspecified lung: Secondary | ICD-10-CM | POA: Insufficient documentation

## 2014-03-17 DIAGNOSIS — C50919 Malignant neoplasm of unspecified site of unspecified female breast: Secondary | ICD-10-CM | POA: Insufficient documentation

## 2014-03-17 DIAGNOSIS — Z7982 Long term (current) use of aspirin: Secondary | ICD-10-CM | POA: Insufficient documentation

## 2014-03-17 DIAGNOSIS — C159 Malignant neoplasm of esophagus, unspecified: Secondary | ICD-10-CM

## 2014-03-17 DIAGNOSIS — M26609 Unspecified temporomandibular joint disorder, unspecified side: Secondary | ICD-10-CM

## 2014-03-17 DIAGNOSIS — E785 Hyperlipidemia, unspecified: Secondary | ICD-10-CM | POA: Insufficient documentation

## 2014-03-17 DIAGNOSIS — M26659 Arthropathy of unspecified temporomandibular joint: Secondary | ICD-10-CM

## 2014-03-17 DIAGNOSIS — Z79899 Other long term (current) drug therapy: Secondary | ICD-10-CM | POA: Insufficient documentation

## 2014-03-17 LAB — COMPREHENSIVE METABOLIC PANEL
ALBUMIN: 2.8 g/dL — AB (ref 3.5–5.2)
ALT: 12 U/L (ref 0–35)
AST: 20 U/L (ref 0–37)
Alkaline Phosphatase: 125 U/L — ABNORMAL HIGH (ref 39–117)
BUN: 7 mg/dL (ref 6–23)
CHLORIDE: 99 meq/L (ref 96–112)
CO2: 32 mEq/L (ref 19–32)
CREATININE: 0.73 mg/dL (ref 0.50–1.10)
Calcium: 10.1 mg/dL (ref 8.4–10.5)
GFR calc Af Amer: >90 mL/min (ref >90)
GLUCOSE: 113 mg/dL — AB (ref 70–99)
POTASSIUM: 4.2 meq/L (ref 3.7–5.3)
Sodium: 140 mEq/L (ref 137–147)
Total Bilirubin: 0.6 mg/dL (ref 0.3–1.2)
Total Protein: 6.8 g/dL (ref 6.0–8.3)

## 2014-03-17 LAB — CBC WITH DIFFERENTIAL/PLATELET
Basophils Absolute: 0 10*3/uL (ref 0.0–0.1)
Basophils Relative: 0 % (ref 0–1)
Eosinophils Absolute: 0.2 10*3/uL (ref 0.0–0.7)
Eosinophils Relative: 3 % (ref 0–5)
HCT: 46.4 % — ABNORMAL HIGH (ref 36.0–46.0)
HEMOGLOBIN: 15.2 g/dL — AB (ref 12.0–15.0)
LYMPHS ABS: 1.3 10*3/uL (ref 0.7–4.0)
LYMPHS PCT: 17 % (ref 12–46)
MCH: 35.3 pg — ABNORMAL HIGH (ref 26.0–34.0)
MCHC: 32.8 g/dL (ref 30.0–36.0)
MCV: 107.9 fL — ABNORMAL HIGH (ref 78.0–100.0)
MONOS PCT: 7 % (ref 3–12)
Monocytes Absolute: 0.6 10*3/uL (ref 0.1–1.0)
NEUTROS PCT: 73 % (ref 43–77)
Neutro Abs: 5.4 10*3/uL (ref 1.7–7.7)
Platelets: 226 10*3/uL (ref 150–400)
RBC: 4.3 MIL/uL (ref 3.87–5.11)
RDW: 15.1 % (ref 11.5–15.5)
WBC: 7.5 10*3/uL (ref 4.0–10.5)

## 2014-03-17 MED ORDER — METOCLOPRAMIDE HCL 5 MG PO TABS: ORAL_TABLET | ORAL | Status: AC

## 2014-03-17 NOTE — Progress Notes (Signed)
[place] A. Barnet Glasgow, M.D.  NEW PATIENT EVALUATION   Name: Elizabeth Moss Date: 03/17/2014 MRN: [id number] DOB: 08/14/1956  PCP: [name], MD   REFERRING PHYSICIAN: Halford Chessman, MD  REASON FOR REFERRAL: Evaluation and treatment of newly diagnosed esophageal carcinoma.    HISTORY OF PRESENT ILLNESS:Elizabeth Moss is a 58 y.o. female who is referred by his family physician and gastroenterologist for evaluation and treatment of esophageal carcinoma. She had presented with right-sided chest discomfort along with dysphagia and recently with regurgitation of food with weight loss of about 19 pounds in 4 weeks. She denies any fever or night sweats and did have therapy in the past for heartburn but not for the last 5-6 years. She denies any abdominal swelling or ankle swelling and does get headaches from Zofran. She also has seasonal allergies in the springtime for which he uses Advil cold and sinus. She denies any diarrhea, constipation, melena, hematochezia, hematuria, skin rash, or seizures. Upper endoscopy was performed revealing a tumor mass in the distal esophagus, biopsy which was adenocarcinoma.   PAST MEDICAL HISTORY:  has a past medical history of Hyperlipidemia; Hypothyroidism; Tobacco abuse; and Esophageal cancer.     PAST SURGICAL HISTORY: Past Surgical History  Procedure Laterality Date  . Abdominal hysterectomy  2000  . Cholecystectomy  1986  . Mandible surgery      lower and upper  . Cesarean section    . Dilation and curettage of uterus    . Esophagogastroduodenoscopy (egd) with propofol N/A 03/01/2014    Procedure: ESOPHAGOGASTRODUODENOSCOPY (EGD) WITH PROPOFOL;  Surgeon: Wonda Horner, MD;  Location: [facility];  Service: Endoscopy;  Laterality: N/A;     CURRENT MEDICATIONS: has a current medication list which includes the following prescription(s): levothyroxine, aspirin, atorvastatin, omega-3 fatty  acids, and ondansetron.   ALLERGIES: Iohexol and Tetracyclines & related   SOCIAL HISTORY:  reports that she has been smoking Cigarettes.  She has a 17.5 pack-year smoking history. She has never used smokeless tobacco. She reports that she does not drink alcohol or use illicit drugs.   FAMILY HISTORY: family history includes Hyperlipidemia in her mother; Other in her mother.    REVIEW OF SYSTEMS:  Other than that discussed above is noncontributory.    PHYSICAL EXAM:  weight is 147 lb 11.2 oz (66.996 kg). Her oral temperature is 97.8 F (36.6 C). Her blood pressure is 119/71 and her pulse is 94. Her respiration is 18 and oxygen saturation is 96%.    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 NECK: supple, thyroid normal size, non-tender, without nodularity CHEST: Normal AP diameter with no breast masses. LYMPH:  no palpable lymphadenopathy in the cervical, axillary or inguinal LUNGS: clear to auscultation and percussion with normal breathing effort HEART: regular rate & rhythm and no murmurs ABDOMEN:abdomen soft, non-tender and normal bowel sounds. No masses, free fluid wave, or shifting dullness. No CVA tenderness. MUSCULOSKELETALl:no cyanosis of digits, no clubbing or edema  NEURO: alert & oriented x 3 with fluent speech, no focal motor/sensory deficits    LABORATORY DATA:  Hospital Outpatient Visit on 03/15/2014  Component Date Value Ref Range Status  .  Glucose-Capillary 03/15/2014 81  70 - 99 mg/dL Final  Admission on 78/49/1476, Discharged on 03/03/2014  Component Date Value Ref Range Status  . WBC 02/27/2014 6.7  4.0 - 10.5 K/uL Final  . RBC 02/27/2014 4.49  3.87 -  5.11 MIL/uL Final  . Hemoglobin 02/27/2014 16.2* 12.0 - 15.0 g/dL Final  . HCT 07/59/7896 49.5* 36.0 - 46.0 % Final  . MCV 02/27/2014 110.2* 78.0 - 100.0 fL Final  . MCH 02/27/2014 36.1* 26.0 - 34.0 pg Final  . MCHC 02/27/2014 32.7  30.0 - 36.0 g/dL Final  . RDW 45/27/3995 14.6  11.5 - 15.5 % Final  . Platelets 02/27/2014 195  150 - 400 K/uL Final  . Neutrophils Relative % 02/27/2014 77  43 - 77 % Final  . Lymphocytes Relative 02/27/2014 13  12 - 46 % Final  . Monocytes Relative 02/27/2014 8  3 - 12 % Final  . Eosinophils Relative 02/27/2014 2  0 - 5 % Final  . Basophils Relative 02/27/2014 0  0 - 1 % Final  . Neutro Abs 02/27/2014 5.2  1.7 - 7.7 K/uL Final  . Lymphs Abs 02/27/2014 0.9  0.7 - 4.0 K/uL Final  . Monocytes Absolute 02/27/2014 0.5  0.1 - 1.0 K/uL Final  . Eosinophils Absolute 02/27/2014 0.1  0.0 - 0.7 K/uL Final  . Basophils Absolute 02/27/2014 0.0  0.0 - 0.1 K/uL Final  . Sodium 02/27/2014 142  137 - 147 mEq/L Final  . Potassium 02/27/2014 3.4* 3.7 - 5.3 mEq/L Final  . Chloride 02/27/2014 95* 96 - 112 mEq/L Final  . CO2 02/27/2014 39* 19 - 32 mEq/L Final  . Glucose, Bld 02/27/2014 116* 70 - 99 mg/dL Final  . BUN 91/61/9882 7  6 - 23 mg/dL Final  . Creatinine, Ser 02/27/2014 0.67  0.50 - 1.10 mg/dL Final  . Calcium 20/88/6855 10.3  8.4 - 10.5 mg/dL Final  . Total Protein 02/27/2014 7.4  6.0 - 8.3 g/dL Final  . Albumin 25/05/492 3.0* 3.5 - 5.2 g/dL Final  . AST 31/99/1906 23  0 - 37 U/L Final  . ALT 02/27/2014 28  0 - 35 U/L Final  . Alkaline Phosphatase 02/27/2014 182* 39 - 117 U/L Final  . Total Bilirubin 02/27/2014 0.9  0.3 - 1.2 mg/dL Final  . GFR calc non Af Amer 02/27/2014 >90  >90 mL/min Final  . GFR calc Af Amer 02/27/2014 >90  >90 mL/min Final   Comment: (NOTE)                          The eGFR has been calculated using the CKD EPI equation.                          This calculation has not been validated in all clinical situations.                           eGFR's persistently <90 mL/min signify possible Chronic Kidney                          Disease.  Marland Kitchen Specimen Description 02/27/2014 LEFT ANTECUBITAL   Final  . Special Requests 02/27/2014 BOTTLES DRAWN AEROBIC AND ANAEROBIC 10CC   Final  . Culture 02/27/2014 NO GROWTH 5 DAYS   Final  . Report Status 02/27/2014 03/04/2014 FINAL   Final  . Specimen  Description 02/27/2014 RIGHT ANTECUBITAL   Final  . Special Requests 02/27/2014 BOTTLES DRAWN AEROBIC AND ANAEROBIC 8CC   Final  . Culture 02/27/2014 NO GROWTH 5 DAYS   Final  . Report Status 02/27/2014 03/04/2014 FINAL   Final  . Lactic Acid, Venous 02/27/2014 1.6  0.5 - 2.2 mmol/L Final  . Magnesium 02/27/2014 2.3  1.5 - 2.5 mg/dL Final  . TSH 02/28/2014 0.243* 0.350 - 4.500 uIU/mL Final   Performed at [facility]  . Sodium 02/28/2014 143  137 - 147 mEq/L Final  . Potassium 02/28/2014 3.8  3.7 - 5.3 mEq/L Final  . Chloride 02/28/2014 101  96 - 112 mEq/L Final  . CO2 02/28/2014 32  19 - 32 mEq/L Final  . Glucose, Bld 02/28/2014 92  70 - 99 mg/dL Final  . BUN 02/28/2014 4* 6 - 23 mg/dL Final  . Creatinine, Ser 02/28/2014 0.69  0.50 - 1.10 mg/dL Final  . Calcium 02/28/2014 9.4  8.4 - 10.5 mg/dL Final  . Total Protein 02/28/2014 5.6* 6.0 - 8.3 g/dL Final  . Albumin 02/28/2014 2.4* 3.5 - 5.2 g/dL Final  . AST 02/28/2014 17  0 - 37 U/L Final  . ALT 02/28/2014 20  0 - 35 U/L Final  . Alkaline Phosphatase 02/28/2014 133* 39 - 117 U/L Final  . Total Bilirubin 02/28/2014 0.7  0.3 - 1.2 mg/dL Final  . GFR calc non Af Amer 02/28/2014 >90  >90 mL/min Final  . GFR calc Af Amer 02/28/2014 >90  >90 mL/min Final   Comment: (NOTE)                          The eGFR has been calculated using the CKD EPI equation.                          This calculation has not been validated in all clinical situations.                          eGFR's persistently <90 mL/min signify possible Chronic Kidney                          Disease.  . WBC 02/28/2014 4.7   4.0 - 10.5 K/uL Final  . RBC 02/28/2014 3.93  3.87 - 5.11 MIL/uL Final  . Hemoglobin 02/28/2014 13.7  12.0 - 15.0 g/dL Final  . HCT 02/28/2014 42.9  36.0 - 46.0 % Final  . MCV 02/28/2014 109.2* 78.0 - 100.0 fL Final  . MCH 02/28/2014 34.9* 26.0 - 34.0 pg Final  . MCHC 02/28/2014 31.9  30.0 - 36.0 g/dL Final  . RDW 02/28/2014 14.5  11.5 - 15.5 % Final  . Platelets 02/28/2014 187  150 - 400 K/uL Final  . Vitamin B-12 02/28/2014 1215* 211 - 911 pg/mL Final   Performed at [facility]  . MRSA by PCR 03/01/2014 NEGATIVE  NEGATIVE Final   Comment:                                 The GeneXpert MRSA Assay (FDA                          approved for NASAL specimens  only), is one component of a                          comprehensive MRSA colonization                          surveillance program. It is not                          intended to diagnose MRSA                          infection nor to guide or                          monitor treatment for                          MRSA infections.    Urinalysis    Component Value Date/Time   COLORURINE YELLOW 02/01/2014 0354   APPEARANCEUR CLEAR 02/01/2014 0354   LABSPEC 1.010 02/01/2014 0354   PHURINE 7.0 02/01/2014 0354   GLUCOSEU NEGATIVE 02/01/2014 0354   HGBUR NEGATIVE 02/01/2014 0354   BILIRUBINUR NEGATIVE 02/01/2014 0354   KETONESUR NEGATIVE 02/01/2014 0354   PROTEINUR NEGATIVE 02/01/2014 0354   UROBILINOGEN 0.2 02/01/2014 0354   NITRITE NEGATIVE 02/01/2014 0354   LEUKOCYTESUR NEGATIVE 02/01/2014 0354      '@RADIOGRAPHY'$ : Ct Abdomen Wo Contrast  02/26/2014   CLINICAL DATA:  Right upper quadrant pain  EXAM: CT ABDOMEN WITHOUT CONTRAST  TECHNIQUE: Multidetector CT imaging of the abdomen was performed following the standard protocol without IV contrast.  COMPARISON:  DG ABD ACUTE W/CHEST dated 02/01/2014  FINDINGS: There is a branching linear nodular pattern within the right lower lobe within air bronchograms (image  number 5 and 6). This is new from prior.  No focal hepatic lesion on this noncontrast exam. Post cholecystectomy. The pancreas, spleen, and kidneys are unremarkable on this noncontrast exam. There is an exophytic low-density round lesion extending from the left kidney which likely represent benign cysts.  The right adrenal gland is enlarged measuring 17 mm (image 16). This enlargement of appears new from comparison chest CT 11/03/2012 and is new from CT of 06/25/2008.  There is circumferential thickening of the distal esophagus approximately 4 cm above the gastroesophageal junction. Single wall thickness measures 17 mm. 7 mm paraesophageal lymph node (image 5).  The stomach is normal. Limited view of the small bowel is unremarkable. No retroperitoneal adenopathy. Small gastrohepatic ligament lymph nodes measure 10 mm.  IMPRESSION: 1. Thickening of the distal esophagus is concerning for esophageal carcinoma. 2. New enlargement of the right adrenal gland is concerning for metastasis. 3. Linear nodular branching pattern in the right lower lobe is nonspecific and could represent infection or neoplasm. 4. Recommend GI consultation and endoscopy to evaluate esophagus. Patient may benefit from FDG PET scan for further evaluation of these concerning findings. These results will be called to the ordering clinician or representative by the Radiologist Assistant, and communication documented in the PACS Dashboard.   Electronically Signed   By: Suzy Bouchard M.D.   On: 02/26/2014 18:13   Dg Chest 2 View  02/26/2014   CLINICAL DATA:  Right lower chest pain, history of tobacco use.  EXAM: CHEST  2 VIEW  COMPARISON:  DG ABD ACUTE W/CHEST  dated 02/01/2014  FINDINGS: There is increased density anteriorly in the right lower lobe consistent with pneumonia. There is subsegmental atelectasis in the lingula. The left lower lobe and both upper lobes exhibit no infiltrates or atelectasis. Stable mildly increased interstitial markings  are present in both lungs. The cardiac silhouette is normal in size. The pulmonary vascularity is not engorged. There is mild tortuosity of the descending thoracic aorta. There is no pleural effusion. The observed portions of the bony thorax exhibit no acute abnormalities.  IMPRESSION: 1. There is patchy anterior right lower lobe pneumonia and there is lingular atelectasis on the left. 2. Mildly increased interstitial markings are present bilaterally consistent with the patient's smoking history. 3. There is no evidence of CHF nor pleural effusion.   Electronically Signed   By: David  Martinique   On: 02/26/2014 16:15   Ct Chest Wo Contrast  02/27/2014   CLINICAL DATA:  Chest pain. Bibasilar infiltrates. Severe contrast allergy.  EXAM: CT CHEST WITHOUT CONTRAST  TECHNIQUE: Multidetector CT imaging of the chest was performed following the standard protocol without IV contrast.  COMPARISON:  11/03/2012  FINDINGS: Fluid-filled upper thoracic esophagus is seen with a masslike density in the lower thoracic esophagus measuring 2.8 x 3.7 cm on image 32. This is suspicious for esophageal carcinoma. New high right paratracheal mediastinal adenopathy is seen measuring 1.7 cm on image 8, consistent with metastatic disease and there also tiny less than 1 cm right supraclavicular lymph nodes, which are new since previous study.  An 11 mm pulmonary nodule is seen in the right middle lobe on image 29 and a 6 mm nodule is seen in central left lower lobe on image 33, highly suspicious for pulmonary metastases. A few other tiny scattered less than 5 mm right lung nodules are seen which are nonspecific on images 11 and 21.  Patchy airspace opacity is seen in the right lung base, suspicious for pneumonia or aspiration. Mild atelectasis or scarring noted in the left lung base. No evidence of pleural effusion.  Small lymph nodes are seen in the gastric patent ligament measuring up to 9 mm, suspicious for early lymph node metastases. A 1.9  cm right adrenal mass is also seen which is new and consistent with adrenal metastasis. No suspicious bone lesions identified  IMPRESSION: Distal esophageal masslike density measuring 3.7 cm, suspicious for primary esophageal carcinoma. Consider endoscopy for further evaluation, as well as PET-CT scan for staging.  Right paratracheal lymphadenopathy, consistent with metastatic disease. Shotty less than 1 cm lymph nodes in the gastrohepatic ligament and right supraclavicular region are also suspicious for early lymph node metastases.  Small bilateral pulmonary metastases, as described above.  Right lower lung airspace disease, suspicious for pneumonia or aspiration. Mild left basilar atelectasis or scarring.  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   Electronically Signed   By: Earle Gell M.D.   On: 02/27/2014 14:50   Nm Pet Image Initial (pi) Skull Base To Thigh  03/15/2014   CLINICAL DATA:  Initial treatment strategy for esophageal carcinoma.  EXAM: NUCLEAR MEDICINE PET SKULL BASE TO THIGH  TECHNIQUE: 7.7 mCi F-18 FDG was injected intravenously. Full-ring PET imaging was performed from the skull base to thigh after the radiotracer. CT data was obtained and used for attenuation correction and anatomic localization.  FASTING BLOOD GLUCOSE:  Value: 81 mg/dl  COMPARISON:  CT CHEST W/O CM dated 02/27/2014; CT ABDOMEN W/O CM dated 02/26/2014  FINDINGS: NECK  No hypermetabolic lymph nodes  in the neck. CT images show no acute findings.  CHEST  A 1.8 cm high right paratracheal lymph node has an SUV max of 8.2. Left subcarinal lymph node measures 1.4 cm with an SUV max of 10.2.  Focal hypermetabolism is seen in the upper esophagus (CT image 48 and PET image 49). Mid esophageal mass measures 3.0 x 3.8 cm with an SUV max of 29.1. Abnormal hypermetabolism extends inferiorly to just above the gastroesophageal junction. Hypermetabolic lymph nodes are seen along the lateral margin of the mid descending  thoracic aorta (PET image 56).  A subpleural nodule in the medial aspect of the right upper lobe measures 1.0 x 1.4 cm with an SUV max of 10.4 (CT series 8, image 34 and PET image 57). An ovoid lesion in the lateral aspect of the inferior right lower lobe measures approximately 1.0 x 2.8 cm (CT series 8, image 49) with an SUV max of 3.9.  A soft tissue nodule in the left lateral paraspinous musculature measures 1.1 x 1.8 cm and has an SUV max of 7.1 (CT image 58 and PET image 59).  CT images show dilatation of the proximal esophagus, indicative of a component of obstruction. Heart size normal. No pericardial effusion. There is volume loss in the right lower lobe, adjacent to the right hemidiaphragm. Scattered pulmonary parenchymal scarring. 5 mm right upper lobe nodule (series 8, image 26), as on 02/27/2014. 6 mm left lower lobe nodule (series 8, image 47), stable. The sub cm pulmonary nodules are too small for PET resolution. No pleural fluid.  ABDOMEN/PELVIS  No abnormal hypermetabolism in the liver. Right adrenal nodule measures 1.7 x 2.3 cm with an SUV max of 9.7. Gastrohepatic ligament lymph nodes measure up to 13 mm in short axis with an SUV max of 19.4 (CT image 85 and PET image 84).  No abnormal hypermetabolism within the spleen or pancreas. No additional hypermetabolic lymph nodes.  There is focal hypermetabolism within the musculature between the right L1 and L2 transverse processes (CT image 102) without a definite CT correlate. A soft tissue nodule in the left gluteus musculature measures 15 mm with an SUV max of 7.8 (CT and PET image 171). No additional areas of abnormal hypermetabolism in the abdomen or pelvis.  CT images show the liver to be grossly unremarkable. Cholecystectomy. Left adrenal gland and right kidney are unremarkable. 1.3 cm low-attenuation exophytic lesion off the left kidney is unchanged. Spleen, pancreas, stomach and bowel are grossly unremarkable. Hysterectomy. Ovaries are  visualized. No free fluid.  SKELETON  No hypermetabolic osseous lesions.  IMPRESSION: 1. Primary esophageal carcinoma with metastatic disease involving mediastinal, subcarinal, descending periaortic and gastrohepatic ligament lymph nodes. Additional metastatic disease is seen in the right upper lobe, right adrenal gland, left paraspinal musculature and left gluteus musculature. 2. Mildly hypermetabolic ovoid subpleural opacity in the right lower lobe, indeterminate. Continued attention on followup exams is warranted. 3. Additional scattered pulmonary nodules are too small for PET resolution.   Electronically Signed   By: Lorin Picket M.D.   On: 03/15/2014 17:02    PATHOLOGY: FINAL for ARTHEA, NOBEL ([id number]) Patient: VETRA, SHINALL Collected: 03/01/2014 Client: [facility] Accession: [id number] Received: 03/01/2014 Anson Fret DOB: Jan 03, 1956 Age: 43 Gender: F Reported: 03/02/2014 [address] Patient Ph: [phone] MRN #: [id number] [place], [place] [address] Visit #: [id number] Chart #: Phone: [phone] Fax: CC: REPORT OF SURGICAL PATHOLOGY FINAL DIAGNOSIS Diagnosis Esophagus, biopsy, distal - ADENOCARCINOMA. Microscopic Comment Dr. Tresa Moore agrees. Called  to Dr. Penelope Coop on 03/02/14. (JDP:gt, 03/02/14) Claudette Laws MD Pathologist, Electronic Signature (Case signed 03/02/2014) Specimen Gross and Clinical Information Specimen(s) Obtained: Esophagus, biopsy, distal Specimen Clinical Information distal esophageal mass, probable cancer (kp) Gross Received in formalin are tan, soft tissue fragments that are submitted in toto. Number: four pieces, Size: 0.2 to 0.4 cm, one block. (SSW:ecj 03/01/2014) Report signed out from the following location(s) Technical Component and Interpretation performed at [place], [place], [place]   IMPRESSION:  #1. 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 #2. Bilateral pulmonary nodules, too small to elucidate on PET scan. #3. Hypothyroidism, on treatment. #4. Status post your surgery for TMJ in remote past with effective right seventh nerve palsy.   PLAN:  #1. Request has been made for HER-2/neu testing on the biopsy specimen. If positive, Herceptin will be added to her chemotherapy regimen. #2. Combination chemotherapy with FOLFOX given every 2 weeks for 2 months will be started as soon as a life port is inserted by interventional radiology. #3. In an effort to facilitate gastric emptying, the patient was started on metoclopramide 5-10 mg 3 times a day before meals and at bedtime. #4. She was told to try to ingest at least 3 or 4 Ensure cans per day to maintain nutrition prior to initiation of chemotherapy. #5. Treatment to begin as soon as the light port is inserted.  I appreciate the opportunity of sharing in her care.   Farrel Gobble, MD 03/17/2014 3:47 PM

## 2014-03-17 NOTE — Progress Notes (Signed)
Labs drawn from left ac. 23 butterfly needle. Pt tolerated well.

## 2014-03-17 NOTE — Progress Notes (Deleted)
[facility] Discharge Instructions  RECOMMENDATIONS MADE BY THE CONSULTANT AND ANY TEST RESULTS WILL BE SENT TO YOUR REFERRING PHYSICIAN.  You will take Reglan four times a day and at bedtime. We will get you set up with port placement at [facility]. Haley Bray will call you and set up a appointment for chemotherapy treatments.  Thank you for choosing [facility] to provide your oncology and hematology care.  To afford each patient quality time with our providers, please arrive at least 15 minutes before your scheduled appointment time.  With your help, our goal is to use those 15 minutes to complete the necessary work-up to ensure our physicians have the information they need to help with your evaluation and healthcare recommendations.    Effective January 1st, 2014, we ask that you re-schedule your appointment with our physicians should you arrive 10 or more minutes late for your appointment.  We strive to give you quality time with our providers, and arriving late affects you and other patients whose appointments are after yours.    Again, thank you for choosing [facility].  Our hope is that these requests will decrease the amount of time that you wait before being seen by our physicians.       _____________________________________________________________  Should you have questions after your visit to [facility], please contact our office at [phone] between the hours of 8:30 a.m. and 5:00 p.m.  Voicemails left after 4:30 p.m. will not be returned until the following business day.  For prescription refill requests, have your pharmacy contact our office with your prescription refill request.      

## 2014-03-17 NOTE — Patient Instructions (Signed)
[facility] Discharge Instructions  RECOMMENDATIONS MADE BY THE CONSULTANT AND ANY TEST RESULTS WILL BE SENT TO YOUR REFERRING PHYSICIAN.  You will take Reglan four times a day and at bedtime. We will get you set up with port placement at [facility]. Lupita Raider will call you and set up a appointment for chemotherapy treatments.  Thank you for choosing [facility] to provide your oncology and hematology care.  To afford each patient quality time with our providers, please arrive at least 15 minutes before your scheduled appointment time.  With your help, our goal is to use those 15 minutes to complete the necessary work-up to ensure our physicians have the information they need to help with your evaluation and healthcare recommendations.    Effective January 1st, 2014, we ask that you re-schedule your appointment with our physicians should you arrive 10 or more minutes late for your appointment.  We strive to give you quality time with our providers, and arriving late affects you and other patients whose appointments are after yours.    Again, thank you for choosing [facility].  Our hope is that these requests will decrease the amount of time that you wait before being seen by our physicians.       _____________________________________________________________  Should you have questions after your visit to [facility], please contact our office at [phone] between the hours of 8:30 a.m. and 5:00 p.m.  Voicemails left after 4:30 p.m. will not be returned until the following business day.  For prescription refill requests, have your pharmacy contact our office with your prescription refill request.

## 2014-03-18 LAB — FERRITIN: FERRITIN: 125 ng/mL (ref 10–291)

## 2014-03-18 LAB — CANCER ANTIGEN 19-9: CA 19-9: 20.5 U/mL — ABNORMAL LOW (ref <35.0)

## 2014-03-18 LAB — CEA: CEA: 27.1 ng/mL — ABNORMAL HIGH (ref 0.0–5.0)

## 2014-03-22 ENCOUNTER — Other Ambulatory Visit (HOSPITAL_COMMUNITY): Payer: Self-pay | Admitting: *Deleted

## 2014-03-22 ENCOUNTER — Other Ambulatory Visit: Payer: Self-pay | Admitting: Radiology

## 2014-03-22 DIAGNOSIS — C159 Malignant neoplasm of esophagus, unspecified: Secondary | ICD-10-CM

## 2014-03-22 MED ORDER — ONDANSETRON 8 MG PO TBDP: 8.0000 mg | ORAL_TABLET | Freq: Three times a day (TID) | ORAL | Status: AC | PRN

## 2014-03-23 ENCOUNTER — Telehealth (HOSPITAL_COMMUNITY): Payer: Self-pay | Admitting: *Deleted

## 2014-03-23 NOTE — Telephone Encounter (Signed)
Neither you nor Gershon Mussel has anything available on 4/20 unless I double book an appt slot? You want me to do that? You don't have anything available this week either.

## 2014-03-23 NOTE — Patient Instructions (Addendum)
Elizabeth Moss   CHEMOTHERAPY INSTRUCTIONS  5FU: bone marrow suppression (low white blood cells - wbcs fight infection, low red blood cells - rbcs make up your blood, low platelets - this is what makes your blood clot, nausea/vomiting, diarrhea, mouth sores, hair loss, dry skin, ocular toxicities (increased tear production, sensitivity to light). You must wear sunscreen/sunglasses. Cover your skin when out in sunlight. You will get burned very easily.   Oxaliplatin - anaphylactic reaction, neurotoxicity (i.e., headache, fatigue, difficulty sleeping, pain). Peripheral neuropathy (numbness/tingling/burning in hands/fingers/feet/toes) - will be aggravated by cold/cool temperatures. We need to know when you develop peripheral neuropathy so that we can monitor it and treat if necessary. Nausea/vomiting, diarrhea, bone marrow suppression (lowers white blood cells (fight infection), lowers red blood cells (make up your blood), lowers platelets (help blood to clot). Pulmonary fibrosis. Once you have received Oxaliplatin do NOT eat or drinking anything cold/cool for 5-10 days! Do NOT breathe in cold/cool air and do NOT touch anything cold for 5-10 days. The time frame varies from patient to patient on the length of time you must abstain from the above mentioned. Best advice is to wait at least 5 days before attempting to reintroduce cold/cool back into life. Slowly reintroduce cool/cold things! Wear gloves when getting items out of the refrigerator (of course, these would be things you are going to heat to eat)!  Leucovorin - this is a medication that is not chemo but given with chemo. This med "rescues" the healthy cells before we administer the drug 5FU. This makes the 5FU work better.    POTENTIAL SIDE EFFECTS OF TREATMENT: Increased Susceptibility to Infection/Bone Marrow Suppression, Nausea/Vomiting/Abd Cramping, Constipation/Diarrhea, Hair Thinning/Hair Loss, Changes in  Character of Skin and Nails (brittleness, dryness,etc.)   SELF IMAGE NEEDS AND REFERRALS MADE: Obtain hair accessories as soon as possible (caps)   EDUCATIONAL MATERIALS GIVEN AND REVIEWED: Chemotherapy and You Booklet Specific Instructions Sheets: Oxaliplatin, 5FU, Leucovorin   SELF CARE ACTIVITIES WHILE ON CHEMOTHERAPY: Increase your fluid intake 48 hours prior to treatment and drink at least 2 quarts per day after treatment., No alcohol intake., No aspirin or other medications unless approved by your oncologist., Eat foods that are light and easy to digest., Eat foods at cold or room temperature., No fried, fatty, or spicy foods immediately before or after treatment., Have teeth cleaned professionally before starting treatment. Keep dentures and partial plates clean., Use soft toothbrush and do not use mouthwashes that contain alcohol. Biotene is a good mouthwash (alcohol free). Use warm salt water gargles (1 teaspoon salt per 1 quart warm water) before and after meals and at bedtime. Or you may rinse with 2 tablespoons of three -percent hydrogen peroxide mixed in eight ounces of water., Always use sunscreen with SPF (Sun Protection Factor) of 30 or higher., Use your nausea medication as directed to prevent nausea., Use your stool softener or laxative as directed to prevent constipation. and Use your anti-diarrheal medication as directed to stop diarrhea.  Please wash your hands for at least 30 seconds using warm soapy water. Handwashing is the #1 way to prevent the spread of germs. Stay away from sick people or people who are getting over a cold. If you develop respiratory systems such as green/yellow mucus production or productive cough or persistent cough let us know and we will see if you need an antibiotic. It is a good idea to keep a pair of gloves on when going into grocery stores/[employer] to decrease  your risk of coming into contact with germs on the carts, etc. Carry alcohol hand gel with  you at all times and use it frequently if out in public. All foods need to be cooked thoroughly. No raw foods. No medium or undercooked meats, eggs. If your food is cooked medium well, it does not need to be hot pink or saturated with bloody liquid at all. Vegetables and fruits need to be washed/rinsed under the faucet with a dish detergent before being consumed. You can eat raw fruits and vegetables unless we tell you otherwise but it would be best if you cooked them or bought frozen. Do not eat off of salad bars or hot bars unless you really trust the cleanliness of the restaurant. If you need dental work, please let us know before you go for your appointment so that we can coordinate the best possible time for you in regards to your chemo regimen. You need to also let your dentist know that you are actively taking chemo. We may need to do labs prior to your dental appointment. We also want your bowels moving at least every other day. If this is not happening, we need to know so that we can get you on a bowel regimen to help you go.     MEDICATIONS: You have been given prescriptions for the following medications:  Ondansetron (Zofran) 8mg  tablet. Take 1 tablet three times a day IF needed for nausea/vomiting.   Metoclopramide (Reglan) 5mg  tablet. Take 1-2 tablets three times a day before meals and @ bedtime to facilitate gastric emptying.   EMLA cream. Apply a quarter size amount to port site 1 hour prior to chemo. Do not rub in. Cover with plastic wrap.   Over-the-Counter Meds:    Colace - this is a stool softener. Take 100mg  capsule 2-6 times a day as needed. If you have to take more than 6 capsules of Colace a day call the [facility].  Senna - this is a mild laxative used to treat mild constipation. May take 2 tabs by mouth daily or up to twice a day as needed for mild constipation,  Milk of Magnesia - this is a laxative used to treat moderate to severe constipation. May take 2-4  tablespoons every 8 hours as needed. May increase to 8 tablespoons x 1 dose and if no bowel movement call the Center.  Imodium - this is for diarrhea. Take 2 tabs after 1st loose stool and then 1 tab after each loose stool until you go a total of 12 hours without a loose stool. Call [employer] if loose stools continue.   SYMPTOMS TO REPORT AS SOON AS POSSIBLE AFTER TREATMENT:  FEVER GREATER THAN 100.5 F  CHILLS WITH OR WITHOUT FEVER  NAUSEA AND VOMITING THAT IS NOT CONTROLLED WITH YOUR NAUSEA MEDICATION  UNUSUAL SHORTNESS OF BREATH  UNUSUAL BRUISING OR BLEEDING  TENDERNESS IN MOUTH AND THROAT WITH OR WITHOUT PRESENCE OF ULCERS  URINARY PROBLEMS  BOWEL PROBLEMS  UNUSUAL RASH    Wear comfortable clothing and clothing appropriate for easy access to any Portacath or PICC line. Let us know if there is anything that we can do to make your therapy better!      I have been informed and understand all of the instructions given to me and have received a copy. I have been instructed to call the clinic [phone] or my family physician as soon as possible for continued medical care, if indicated. I do  not have any more questions at this time but understand that I may call the [facility] or the Patient Navigator at [phone] during office hours should I have questions or need assistance in obtaining follow-up care.      _________________________________________      _______________     __________ Signature of Patient or Authorized Representative        Date                            Time      _________________________________________ Nurse's Signature      Oxaliplatin Injection What is this medicine? OXALIPLATIN (ox AL i PLA tin) is a chemotherapy drug. It targets fast dividing cells, like cancer cells, and causes these cells to die. This medicine is used to treat cancers of the colon and rectum, and many other cancers. This medicine may be used for  other purposes; ask your health care provider or pharmacist if you have questions. COMMON BRAND NAME(S): Eloxatin What should I tell my health care provider before I take this medicine? They need to know if you have any of these conditions: -kidney disease -an unusual or allergic reaction to oxaliplatin, other chemotherapy, other medicines, foods, dyes, or preservatives -pregnant or trying to get pregnant -breast-feeding How should I use this medicine? This drug is given as an infusion into a vein. It is administered in a hospital or clinic by a specially trained health care professional. Talk to your pediatrician regarding the use of this medicine in children. Special care may be needed. Overdosage: If you think you have taken too much of this medicine contact a poison control center or emergency room at once. NOTE: This medicine is only for you. Do not share this medicine with others. What if I miss a dose? It is important not to miss a dose. Call your doctor or health care professional if you are unable to keep an appointment. What may interact with this medicine? -medicines to increase blood counts like filgrastim, pegfilgrastim, sargramostim -probenecid -some antibiotics like amikacin, gentamicin, neomycin, polymyxin B, streptomycin, tobramycin -zalcitabine Talk to your doctor or health care professional before taking any of these medicines: -acetaminophen -aspirin -ibuprofen -ketoprofen -naproxen This list may not describe all possible interactions. Give your health care provider a list of all the medicines, herbs, non-prescription drugs, or dietary supplements you use. Also tell them if you smoke, drink alcohol, or use illegal drugs. Some items may interact with your medicine. What should I watch for while using this medicine? Your condition will be monitored carefully while you are receiving this medicine. You will need important blood work done while you are taking this  medicine. This medicine can make you more sensitive to cold. Do not drink cold drinks or use ice. Cover exposed skin before coming in contact with cold temperatures or cold objects. When out in cold weather wear warm clothing and cover your mouth and nose to warm the air that goes into your lungs. Tell your doctor if you get sensitive to the cold. This drug may make you feel generally unwell. This is not uncommon, as chemotherapy can affect healthy cells as well as cancer cells. Report any side effects. Continue your course of treatment even though you feel ill unless your doctor tells you to stop. In some cases, you may be given additional medicines to help with side effects. Follow all directions for their use. Call your doctor or health  care professional for advice if you get a fever, chills or sore throat, or other symptoms of a cold or flu. Do not treat yourself. This drug decreases your body's ability to fight infections. Try to avoid being around people who are sick. This medicine may increase your risk to bruise or bleed. Call your doctor or health care professional if you notice any unusual bleeding. Be careful brushing and flossing your teeth or using a toothpick because you may get an infection or bleed more easily. If you have any dental work done, tell your dentist you are receiving this medicine. Avoid taking products that contain aspirin, acetaminophen, ibuprofen, naproxen, or ketoprofen unless instructed by your doctor. These medicines may hide a fever. Do not become pregnant while taking this medicine. Women should inform their doctor if they wish to become pregnant or think they might be pregnant. There is a potential for serious side effects to an unborn child. Talk to your health care professional or pharmacist for more information. Do not breast-feed an infant while taking this medicine. Call your doctor or health care professional if you get diarrhea. Do not treat yourself. What side  effects may I notice from receiving this medicine? Side effects that you should report to your doctor or health care professional as soon as possible: -allergic reactions like skin rash, itching or hives, swelling of the face, lips, or tongue -low blood counts - This drug may decrease the number of white blood cells, red blood cells and platelets. You may be at increased risk for infections and bleeding. -signs of infection - fever or chills, cough, sore throat, pain or difficulty passing urine -signs of decreased platelets or bleeding - bruising, pinpoint red spots on the skin, black, tarry stools, nosebleeds -signs of decreased red blood cells - unusually weak or tired, fainting spells, lightheadedness -breathing problems -chest pain, pressure -cough -diarrhea -jaw tightness -mouth sores -nausea and vomiting -pain, swelling, redness or irritation at the injection site -pain, tingling, numbness in the hands or feet -problems with balance, talking, walking -redness, blistering, peeling or loosening of the skin, including inside the mouth -trouble passing urine or change in the amount of urine Side effects that usually do not require medical attention (report to your doctor or health care professional if they continue or are bothersome): -changes in vision -constipation -hair loss -loss of appetite -metallic taste in the mouth or changes in taste -stomach pain This list may not describe all possible side effects. Call your doctor for medical advice about side effects. You may report side effects to FDA at 1-800-FDA-1088. Where should I keep my medicine? This drug is given in a hospital or clinic and will not be stored at home. NOTE: This sheet is a summary. It may not cover all possible information. If you have questions about this medicine, talk to your doctor, pharmacist, or health care provider.  2014, Elsevier/Gold Standard. (2008-06-22 17:22:47) Fluorouracil, 5-FU injection What  is this medicine? FLUOROURACIL, 5-FU (flure oh YOOR a sil) is a chemotherapy drug. It slows the growth of cancer cells. This medicine is used to treat many types of cancer like breast cancer, colon or rectal cancer, pancreatic cancer, and stomach cancer. This medicine may be used for other purposes; ask your health care provider or pharmacist if you have questions. COMMON BRAND NAME(S): Adrucil What should I tell my health care provider before I take this medicine? They need to know if you have any of these conditions: -blood disorders -dihydropyrimidine dehydrogenase (DPD)  deficiency -infection (especially a virus infection such as chickenpox, cold sores, or herpes) -kidney disease -liver disease -malnourished, poor nutrition -recent or ongoing radiation therapy -an unusual or allergic reaction to fluorouracil, other chemotherapy, other medicines, foods, dyes, or preservatives -pregnant or trying to get pregnant -breast-feeding How should I use this medicine? This drug is given as an infusion or injection into a vein. It is administered in a hospital or clinic by a specially trained health care professional. Talk to your pediatrician regarding the use of this medicine in children. Special care may be needed. Overdosage: If you think you have taken too much of this medicine contact a poison control center or emergency room at once. NOTE: This medicine is only for you. Do not share this medicine with others. What if I miss a dose? It is important not to miss your dose. Call your doctor or health care professional if you are unable to keep an appointment. What may interact with this medicine? -allopurinol -cimetidine -dapsone -digoxin -hydroxyurea -leucovorin -levamisole -medicines for seizures like ethotoin, fosphenytoin, phenytoin -medicines to increase blood counts like filgrastim, pegfilgrastim, sargramostim -medicines that treat or prevent blood clots like warfarin, enoxaparin,  and dalteparin -methotrexate -metronidazole -pyrimethamine -some other chemotherapy drugs like busulfan, cisplatin, estramustine, vinblastine -trimethoprim -trimetrexate -vaccines Talk to your doctor or health care professional before taking any of these medicines: -acetaminophen -aspirin -ibuprofen -ketoprofen -naproxen This list may not describe all possible interactions. Give your health care provider a list of all the medicines, herbs, non-prescription drugs, or dietary supplements you use. Also tell them if you smoke, drink alcohol, or use illegal drugs. Some items may interact with your medicine. What should I watch for while using this medicine? Visit your doctor for checks on your progress. This drug may make you feel generally unwell. This is not uncommon, as chemotherapy can affect healthy cells as well as cancer cells. Report any side effects. Continue your course of treatment even though you feel ill unless your doctor tells you to stop. In some cases, you may be given additional medicines to help with side effects. Follow all directions for their use. Call your doctor or health care professional for advice if you get a fever, chills or sore throat, or other symptoms of a cold or flu. Do not treat yourself. This drug decreases your body's ability to fight infections. Try to avoid being around people who are sick. This medicine may increase your risk to bruise or bleed. Call your doctor or health care professional if you notice any unusual bleeding. Be careful brushing and flossing your teeth or using a toothpick because you may get an infection or bleed more easily. If you have any dental work done, tell your dentist you are receiving this medicine. Avoid taking products that contain aspirin, acetaminophen, ibuprofen, naproxen, or ketoprofen unless instructed by your doctor. These medicines may hide a fever. Do not become pregnant while taking this medicine. Women should inform  their doctor if they wish to become pregnant or think they might be pregnant. There is a potential for serious side effects to an unborn child. Talk to your health care professional or pharmacist for more information. Do not breast-feed an infant while taking this medicine. Men should inform their doctor if they wish to father a child. This medicine may lower sperm counts. Do not treat diarrhea with over the counter products. Contact your doctor if you have diarrhea that lasts more than 2 days or if it is severe and watery. This  medicine can make you more sensitive to the sun. Keep out of the sun. If you cannot avoid being in the sun, wear protective clothing and use sunscreen. Do not use sun lamps or tanning beds/booths. What side effects may I notice from receiving this medicine? Side effects that you should report to your doctor or health care professional as soon as possible: -allergic reactions like skin rash, itching or hives, swelling of the face, lips, or tongue -low blood counts - this medicine may decrease the number of white blood cells, red blood cells and platelets. You may be at increased risk for infections and bleeding. -signs of infection - fever or chills, cough, sore throat, pain or difficulty passing urine -signs of decreased platelets or bleeding - bruising, pinpoint red spots on the skin, black, tarry stools, blood in the urine -signs of decreased red blood cells - unusually weak or tired, fainting spells, lightheadedness -breathing problems -changes in vision -chest pain -mouth sores -nausea and vomiting -pain, swelling, redness at site where injected -pain, tingling, numbness in the hands or feet -redness, swelling, or sores on hands or feet -stomach pain -unusual bleeding Side effects that usually do not require medical attention (report to your doctor or health care professional if they continue or are bothersome): -changes in finger or toe nails -diarrhea -dry or  itchy skin -hair loss -headache -loss of appetite -sensitivity of eyes to the light -stomach upset -unusually teary eyes This list may not describe all possible side effects. Call your doctor for medical advice about side effects. You may report side effects to FDA at 1-800-FDA-1088. Where should I keep my medicine? This drug is given in a hospital or clinic and will not be stored at home. NOTE: This sheet is a summary. It may not cover all possible information. If you have questions about this medicine, talk to your doctor, pharmacist, or health care provider.  2014, Elsevier/Gold Standard. (2008-03-31 13:53:16) Leucovorin injection What is this medicine? LEUCOVORIN (loo koe VOR in) is used to prevent or treat the harmful effects of some medicines. This medicine is used to treat anemia caused by a low amount of folic acid in the body. It is also used with 5-fluorouracil (5-FU) to treat colon cancer. This medicine may be used for other purposes; ask your health care provider or pharmacist if you have questions. What should I tell my health care provider before I take this medicine? They need to know if you have any of these conditions: -anemia from low levels of vitamin B-12 in the blood -an unusual or allergic reaction to leucovorin, folic acid, other medicines, foods, dyes, or preservatives -pregnant or trying to get pregnant -breast-feeding How should I use this medicine? This medicine is for injection into a muscle or into a vein. It is given by a health care professional in a hospital or clinic setting. Talk to your pediatrician regarding the use of this medicine in children. Special care may be needed. Overdosage: If you think you have taken too much of this medicine contact a poison control center or emergency room at once. NOTE: This medicine is only for you. Do not share this medicine with others. What if I miss a dose? This does not apply. What may interact with this  medicine? -capecitabine -fluorouracil -phenobarbital -phenytoin -primidone -trimethoprim-sulfamethoxazole This list may not describe all possible interactions. Give your health care provider a list of all the medicines, herbs, non-prescription drugs, or dietary supplements you use. Also tell them if you smoke,  drink alcohol, or use illegal drugs. Some items may interact with your medicine. What should I watch for while using this medicine? Your condition will be monitored carefully while you are receiving this medicine. This medicine may increase the side effects of 5-fluorouracil, 5-FU. Tell your doctor or health care professional if you have diarrhea or mouth sores that do not get better or that get worse. What side effects may I notice from receiving this medicine? Side effects that you should report to your doctor or health care professional as soon as possible: -allergic reactions like skin rash, itching or hives, swelling of the face, lips, or tongue -breathing problems -fever, infection -mouth sores -unusual bleeding or bruising -unusually weak or tired Side effects that usually do not require medical attention (report to your doctor or health care professional if they continue or are bothersome): -constipation or diarrhea -loss of appetite -nausea, vomiting This list may not describe all possible side effects. Call your doctor for medical advice about side effects. You may report side effects to FDA at 1-800-FDA-1088. Where should I keep my medicine? This drug is given in a hospital or clinic and will not be stored at home. NOTE: This sheet is a summary. It may not cover all possible information. If you have questions about this medicine, talk to your doctor, pharmacist, or health care provider.  2014, Elsevier/Gold Standard. (2008-06-01 16:50:29) Dexamethasone injection What is this medicine? DEXAMETHASONE (dex a METH a sone) is a corticosteroid. It is used to treat  inflammation of the skin, joints, lungs, and other organs. Common conditions treated include asthma, allergies, and arthritis. It is also used for other conditions, like blood disorders and diseases of the adrenal glands. This medicine may be used for other purposes; ask your health care provider or pharmacist if you have questions. COMMON BRAND NAME(S): Decadron, Solurex What should I tell my health care provider before I take this medicine? They need to know if you have any of these conditions: -blood clotting problems -Cushing's syndrome -diabetes -glaucoma -heart problems or disease -high blood pressure -infection like herpes, measles, tuberculosis, or chickenpox -kidney disease -liver disease -mental problems -myasthenia gravis -osteoporosis -previous heart attack -seizures -stomach, ulcer or intestine disease including colitis and diverticulitis -thyroid problem -an unusual or allergic reaction to dexamethasone, corticosteroids, other medicines, lactose, foods, dyes, or preservatives -pregnant or trying to get pregnant -breast-feeding How should I use this medicine? This medicine is for injection into a muscle, joint, lesion, soft tissue, or vein. It is given by a health care professional in a hospital or clinic setting. Talk to your pediatrician regarding the use of this medicine in children. Special care may be needed. Overdosage: If you think you have taken too much of this medicine contact a poison control center or emergency room at once. NOTE: This medicine is only for you. Do not share this medicine with others. What if I miss a dose? This may not apply. If you are having a series of injections over a prolonged period, try not to miss an appointment. Call your doctor or health care professional to reschedule if you are unable to keep an appointment. What may interact with this medicine? Do not take this medicine with any of the following medications: -mifepristone,  RU-486 -vaccines This medicine may also interact with the following medications: -amphotericin B -antibiotics like clarithromycin, erythromycin, and troleandomycin -aspirin and aspirin-like drugs -barbiturates like phenobarbital -carbamazepine -cholestyramine -cholinesterase inhibitors like donepezil, galantamine, rivastigmine, and tacrine -cyclosporine -digoxin -diuretics -ephedrine -female hormones,  like estrogens or progestins and birth control pills -indinavir -isoniazid -ketoconazole -medicines for diabetes -medicines that improve muscle tone or strength for conditions like myasthenia gravis -NSAIDs, medicines for pain and inflammation, like ibuprofen or naproxen -phenytoin -rifampin -thalidomide -warfarin This list may not describe all possible interactions. Give your health care provider a list of all the medicines, herbs, non-prescription drugs, or dietary supplements you use. Also tell them if you smoke, drink alcohol, or use illegal drugs. Some items may interact with your medicine. What should I watch for while using this medicine? Your condition will be monitored carefully while you are receiving this medicine. If you are taking this medicine for a long time, carry an identification card with your name and address, the type and dose of your medicine, and your doctor's name and address. This medicine may increase your risk of getting an infection. Stay away from people who are sick. Tell your doctor or health care professional if you are around anyone with measles or chickenpox. Talk to your health care provider before you get any vaccines that you take this medicine. If you are going to have surgery, tell your doctor or health care professional that you have taken this medicine within the last twelve months. Ask your doctor or health care professional about your diet. You may need to lower the amount of salt you eat. The medicine can increase your blood sugar. If you are a  diabetic check with your doctor if you need help adjusting the dose of your diabetic medicine. What side effects may I notice from receiving this medicine? Side effects that you should report to your doctor or health care professional as soon as possible: -allergic reactions like skin rash, itching or hives, swelling of the face, lips, or tongue -black or tarry stools -change in the amount of urine -changes in vision -confusion, excitement, restlessness, a false sense of well-being -fever, sore throat, sneezing, cough, or other signs of infection, wounds that will not heal -hallucinations -increased thirst -mental depression, mood swings, mistaken feelings of self importance or of being mistreated -pain in hips, back, ribs, arms, shoulders, or legs -pain, redness, or irritation at the injection site -redness, blistering, peeling or loosening of the skin, including inside the mouth -rounding out of face -swelling of feet or lower legs -unusual bleeding or bruising -unusual tired or weak -wounds that do not heal Side effects that usually do not require medical attention (report to your doctor or health care professional if they continue or are bothersome): -diarrhea or constipation -change in taste -headache -nausea, vomiting -skin problems, acne, thin and shiny skin -touble sleeping -unusual growth of hair on the face or body -weight gain This list may not describe all possible side effects. Call your doctor for medical advice about side effects. You may report side effects to FDA at 1-800-FDA-1088. Where should I keep my medicine? This drug is given in a hospital or clinic and will not be stored at home. NOTE: This sheet is a summary. It may not cover all possible information. If you have questions about this medicine, talk to your doctor, pharmacist, or health care provider.  2014, Elsevier/Gold Standard. (2008-03-18 14:04:12) Ondansetron injection What is this  medicine? ONDANSETRON (on DAN se tron) is used to treat nausea and vomiting caused by chemotherapy. It is also used to prevent or treat nausea and vomiting after surgery. This medicine may be used for other purposes; ask your health care provider or pharmacist if you have questions. COMMON BRAND  NAME(S): Zofran What should I tell my health care provider before I take this medicine? They need to know if you have any of these conditions: -heart disease -history of irregular heartbeat -liver disease -low levels of magnesium or potassium in the blood -an unusual or allergic reaction to ondansetron, granisetron, other medicines, foods, dyes, or preservatives -pregnant or trying to get pregnant -breast-feeding How should I use this medicine? This medicine is for infusion into a vein. It is given by a health care professional in a hospital or clinic setting. Talk to your pediatrician regarding the use of this medicine in children. Special care may be needed. Overdosage: If you think you have taken too much of this medicine contact a poison control center or emergency room at once. NOTE: This medicine is only for you. Do not share this medicine with others. What if I miss a dose? This does not apply. What may interact with this medicine? Do not take this medicine with any of the following medications: -apomorphine -certain medicines for fungal infections like fluconazole, itraconazole, ketoconazole, posaconazole, voriconazole -cisapride -dofetilide -dronedarone -pimozide -thioridazine -ziprasidone  This medicine may also interact with the following medications: -carbamazepine -certain medicines for depression, anxiety, or psychotic disturbances -fentanyl -linezolid -MAOIs like Carbex, Eldepryl, Marplan, Nardil, and Parnate -methylene blue (injected into a vein) -other medicines that prolong the QT interval (cause an abnormal heart rhythm) -phenytoin -rifampicin -tramadol This list  may not describe all possible interactions. Give your health care provider a list of all the medicines, herbs, non-prescription drugs, or dietary supplements you use. Also tell them if you smoke, drink alcohol, or use illegal drugs. Some items may interact with your medicine. What should I watch for while using this medicine? Your condition will be monitored carefully while you are receiving this medicine. What side effects may I notice from receiving this medicine? Side effects that you should report to your doctor or health care professional as soon as possible: -allergic reactions like skin rash, itching or hives, swelling of the face, lips, or tongue -breathing problems -confusion -dizziness -fast or irregular heartbeat -feeling faint or lightheaded, falls -fever and chills -loss of balance or coordination -seizures -sweating -swelling of the hands and feet -tightness in the chest -tremors -unusually weak or tired Side effects that usually do not require medical attention (report to your doctor or health care professional if they continue or are bothersome): -constipation or diarrhea -headache This list may not describe all possible side effects. Call your doctor for medical advice about side effects. You may report side effects to FDA at 1-800-FDA-1088. Where should I keep my medicine? This drug is given in a hospital or clinic and will not be stored at home. NOTE: This sheet is a summary. It may not cover all possible information. If you have questions about this medicine, talk to your doctor, pharmacist, or health care provider.  2014, Elsevier/Gold Standard. (2013-09-02 16:18:28) Lidocaine; Prilocaine cream What is this medicine? LIDOCAINE; PRILOCAINE (LYE doe kane; PRIL oh kane) is a topical anesthetic that causes loss of feeling in the skin and surrounding tissues. It is used to numb the skin before procedures or injections. This medicine may be used for other purposes; ask  your health care provider or pharmacist if you have questions. COMMON BRAND NAME(S): EMLA What should I tell my health care provider before I take this medicine? They need to know if you have any of these conditions: -glucose-6-phosphate deficiencies -heart disease -kidney or liver disease -methemoglobinemia -an unusual or  allergic reaction to lidocaine, prilocaine, other medicines, foods, dyes, or preservatives -pregnant or trying to get pregnant -breast-feeding How should I use this medicine? This medicine is for external use only on the skin. Do not take by mouth. Follow the directions on the prescription label. Wash hands before and after use. Do not use more or leave in contact with the skin longer than directed. Do not apply to eyes or open wounds. It can cause irritation and blurred or temporary loss of vision. If this medicine comes in contact with your eyes, immediately rinse the eye with water. Do not touch or rub the eye. Contact your health care provider right away. Talk to your pediatrician regarding the use of this medicine in children. While this medicine may be prescribed for children for selected conditions, precautions do apply. Overdosage: If you think you have taken too much of this medicine contact a poison control center or emergency room at once. NOTE: This medicine is only for you. Do not share this medicine with others. What if I miss a dose? This medicine is usually only applied once prior to each procedure. It must be in contact with the skin for a period of time for it to work. If you applied this medicine later than directed, tell your health care professional before starting the procedure. What may interact with this medicine? -acetaminophen -chloroquine -dapsone -medicines to control heart rhythm -nitrates like nitroglycerin and nitroprusside -other ointments, creams, or sprays that may contain anesthetic  medicine -phenobarbital -phenytoin -quinine -sulfonamides like sulfacetamide, sulfamethoxazole, sulfasalazine and others This list may not describe all possible interactions. Give your health care provider a list of all the medicines, herbs, non-prescription drugs, or dietary supplements you use. Also tell them if you smoke, drink alcohol, or use illegal drugs. Some items may interact with your medicine. What should I watch for while using this medicine? Be careful to avoid injury to the treated area while it is numb and you are not aware of pain. Avoid scratching, rubbing, or exposing the treated area to hot or cold temperatures until complete sensation has returned. The numb feeling will wear off a few hours after applying the cream. What side effects may I notice from receiving this medicine? Side effects that you should report to your doctor or health care professional as soon as possible: -blurred vision -chest pain -difficulty breathing -dizziness -drowsiness -fast or irregular heartbeat -skin rash or itching -swelling of your throat, lips, or face -trembling Side effects that usually do not require medical attention (report to your doctor or health care professional if they continue or are bothersome): -changes in ability to feel hot or cold -redness and swelling at the application site This list may not describe all possible side effects. Call your doctor for medical advice about side effects. You may report side effects to FDA at 1-800-FDA-1088. Where should I keep my medicine? Keep out of reach of children. Store at room temperature between 15 and 30 degrees C (59 and 86 degrees F). Keep container tightly closed. Throw away any unused medicine after the expiration date. NOTE: This sheet is a summary. It may not cover all possible information. If you have questions about this medicine, talk to your doctor, pharmacist, or health care provider.  2014, Elsevier/Gold Standard.  (2008-05-31 17:14:35)

## 2014-03-24 ENCOUNTER — Ambulatory Visit (HOSPITAL_COMMUNITY)
Admission: RE | Admit: 2014-03-24 | Discharge: 2014-03-24 | Disposition: A | Discharge status: Home / Self Care | Payer: 59 | Source: Ambulatory Visit | Attending: Hematology and Oncology | Admitting: Hematology and Oncology

## 2014-03-24 ENCOUNTER — Other Ambulatory Visit (HOSPITAL_COMMUNITY): Payer: Self-pay | Admitting: Hematology and Oncology

## 2014-03-24 ENCOUNTER — Encounter (HOSPITAL_COMMUNITY): Payer: Self-pay

## 2014-03-24 DIAGNOSIS — E039 Hypothyroidism, unspecified: Secondary | ICD-10-CM | POA: Insufficient documentation

## 2014-03-24 DIAGNOSIS — E785 Hyperlipidemia, unspecified: Secondary | ICD-10-CM | POA: Insufficient documentation

## 2014-03-24 DIAGNOSIS — F17200 Nicotine dependence, unspecified, uncomplicated: Secondary | ICD-10-CM | POA: Insufficient documentation

## 2014-03-24 DIAGNOSIS — C159 Malignant neoplasm of esophagus, unspecified: Secondary | ICD-10-CM

## 2014-03-24 DIAGNOSIS — Z79899 Other long term (current) drug therapy: Secondary | ICD-10-CM | POA: Insufficient documentation

## 2014-03-24 LAB — PROTIME-INR
INR: 0.93 (ref 0.00–1.49)
Prothrombin Time: 12.3 seconds (ref 11.6–15.2)

## 2014-03-24 LAB — CBC WITH DIFFERENTIAL/PLATELET
BASOS ABS: 0 10*3/uL (ref 0.0–0.1)
BASOS PCT: 0 % (ref 0–1)
Eosinophils Absolute: 0.2 10*3/uL (ref 0.0–0.7)
Eosinophils Relative: 4 % (ref 0–5)
HCT: 45.4 % (ref 36.0–46.0)
HEMOGLOBIN: 15.1 g/dL — AB (ref 12.0–15.0)
Lymphocytes Relative: 20 % (ref 12–46)
Lymphs Abs: 1.3 10*3/uL (ref 0.7–4.0)
MCH: 34.8 pg — ABNORMAL HIGH (ref 26.0–34.0)
MCHC: 33.3 g/dL (ref 30.0–36.0)
MCV: 104.6 fL — ABNORMAL HIGH (ref 78.0–100.0)
MONOS PCT: 9 % (ref 3–12)
Monocytes Absolute: 0.6 10*3/uL (ref 0.1–1.0)
NEUTROS PCT: 67 % (ref 43–77)
Neutro Abs: 4.3 10*3/uL (ref 1.7–7.7)
Platelets: 197 10*3/uL (ref 150–400)
RBC: 4.34 MIL/uL (ref 3.87–5.11)
RDW: 14.7 % (ref 11.5–15.5)
WBC: 6.4 10*3/uL (ref 4.0–10.5)

## 2014-03-24 MED ORDER — LIDOCAINE HCL 1 % IJ SOLN
INTRAMUSCULAR | Status: AC
  Filled 2014-03-24: qty 20

## 2014-03-24 MED ORDER — HEPARIN SOD (PORK) LOCK FLUSH 100 UNIT/ML IV SOLN
INTRAVENOUS | Status: AC
  Filled 2014-03-24: qty 5

## 2014-03-24 MED ORDER — MIDAZOLAM HCL 2 MG/2ML IJ SOLN
INTRAMUSCULAR | Status: AC | PRN
Start: 2014-03-24 — End: 2014-03-24
  Administered 2014-03-24 (×2): 1 mg via INTRAVENOUS

## 2014-03-24 MED ORDER — FENTANYL CITRATE 0.05 MG/ML IJ SOLN
INTRAMUSCULAR | Status: AC
  Filled 2014-03-24: qty 6

## 2014-03-24 MED ORDER — SODIUM CHLORIDE 0.9 % IV SOLN
INTRAVENOUS | Status: DC
  Administered 2014-03-24: 10:00:00 via INTRAVENOUS

## 2014-03-24 MED ORDER — CEFAZOLIN SODIUM-DEXTROSE 2-3 GM-% IV SOLR
2.0000 g | Freq: Once | INTRAVENOUS | Status: AC
  Administered 2014-03-24: 2 g via INTRAVENOUS

## 2014-03-24 MED ORDER — FENTANYL CITRATE 0.05 MG/ML IJ SOLN
INTRAMUSCULAR | Status: AC | PRN
  Administered 2014-03-24 (×2): 25 ug via INTRAVENOUS

## 2014-03-24 MED ORDER — CEFAZOLIN SODIUM-DEXTROSE 2-3 GM-% IV SOLR
INTRAVENOUS | Status: AC
  Administered 2014-03-24: 2 g via INTRAVENOUS
  Filled 2014-03-24: qty 50

## 2014-03-24 MED ORDER — MIDAZOLAM HCL 2 MG/2ML IJ SOLN
INTRAMUSCULAR | Status: AC
  Filled 2014-03-24: qty 6

## 2014-03-24 MED ORDER — HEPARIN SOD (PORK) LOCK FLUSH 100 UNIT/ML IV SOLN
INTRAVENOUS | Status: AC | PRN
  Administered 2014-03-24: 500 [IU]

## 2014-03-24 NOTE — Procedures (Signed)
RIJV PAC Tip SVC RA No comp 

## 2014-03-24 NOTE — H&P (Signed)
Elizabeth Moss is an 58 y.o. female.   Chief Complaint: "I'm here to get a port a cath" HPI: Patient with history of stage 4 esophageal adenocarcinoma presents today for port a cath placement for chemotherapy.  Past Medical History  Diagnosis Date  . Hyperlipidemia   . Hypothyroidism   . Tobacco abuse   . Esophageal cancer     Past Surgical History  Procedure Laterality Date  . Abdominal hysterectomy  2000  . Cholecystectomy  1986  . Mandible surgery      lower and upper  . Cesarean section    . Dilation and curettage of uterus    . Esophagogastroduodenoscopy (egd) with propofol N/A 03/01/2014    Procedure: ESOPHAGOGASTRODUODENOSCOPY (EGD) WITH PROPOFOL;  Surgeon: Wonda Horner, MD;  Location: [facility];  Service: Endoscopy;  Laterality: N/A;    Family History  Problem Relation Age of Onset  . Hyperlipidemia Mother   . Other Mother     varicose veins   Social History:  reports that she has been smoking Cigarettes.  She has a 17.5 pack-year smoking history. She has never used smokeless tobacco. She reports that she does not drink alcohol or use illicit drugs.  Allergies:  Allergies  Allergen Reactions  . Iohexol Anaphylaxis     Desc: PATIENT GOES INTO ANAPHALATIC SHOCK AFTER INJECTION OF OMNIPAQUE, Onset Date: [id number]   . Tetracyclines & Related Rash    Current outpatient prescriptions:levothyroxine (SYNTHROID, LEVOTHROID) 88 MCG tablet, Take 88 mcg by mouth daily before breakfast., Disp: , Rfl: ;  metoCLOPramide (REGLAN) 5 MG tablet, Take 1 or 2 tablets 3 times a day before meals and at bedtime to facilitate gastric emptying., Disp: 100 tablet, Rfl: 3 ondansetron (ZOFRAN-ODT) 4 MG disintegrating tablet, Take 1 tablet by mouth 3 (three) times daily as needed for nausea or vomiting. nausea, Disp: , Rfl: ;  Pseudoephedrine-Ibuprofen (ADVIL COLD/SINUS PO), Take 1 tablet by mouth daily as needed (allergies)., Disp: , Rfl: ;  ondansetron (ZOFRAN ODT) 8 MG disintegrating tablet,  Take 1 tablet (8 mg total) by mouth every 8 (eight) hours as needed for nausea or vomiting., Disp: 30 tablet, Rfl: 2 Current facility-administered medications:0.9 %  sodium chloride infusion, , Intravenous, Continuous, Ascencion Dike, PA-C, Last Rate: 75 mL/hr at 03/24/14 1023;  ceFAZolin (ANCEF) IVPB 2 g/50 mL premix, 2 g, Intravenous, Once, Ascencion Dike, PA-C   Results for orders placed during the hospital encounter of 03/24/14 (from the past 48 hour(s))  CBC WITH DIFFERENTIAL     Status: Abnormal   Collection Time    03/24/14 10:25 AM      Result Value Ref Range   WBC 6.4  4.0 - 10.5 K/uL   RBC 4.34  3.87 - 5.11 MIL/uL   Hemoglobin 15.1 (*) 12.0 - 15.0 g/dL   HCT 45.4  36.0 - 46.0 %   MCV 104.6 (*) 78.0 - 100.0 fL   MCH 34.8 (*) 26.0 - 34.0 pg   MCHC 33.3  30.0 - 36.0 g/dL   RDW 14.7  11.5 - 15.5 %   Platelets 197  150 - 400 K/uL   Neutrophils Relative % 67  43 - 77 %   Neutro Abs 4.3  1.7 - 7.7 K/uL   Lymphocytes Relative 20  12 - 46 %   Lymphs Abs 1.3  0.7 - 4.0 K/uL   Monocytes Relative 9  3 - 12 %   Monocytes Absolute 0.6  0.1 - 1.0 K/uL   Eosinophils Relative 4  0 - 5 %   Eosinophils Absolute 0.2  0.0 - 0.7 K/uL   Basophils Relative 0  0 - 1 %   Basophils Absolute 0.0  0.0 - 0.1 K/uL   No results found.  Review of Systems  Constitutional: Positive for weight loss. Negative for fever and chills.  HENT:       Occ HA's  Respiratory: Negative for hemoptysis.        Occ cough, dyspnea with exertion  Cardiovascular: Negative for chest pain.  Gastrointestinal: Negative for abdominal pain and blood in stool.       Occ N/V  Genitourinary: Negative for hematuria.  Musculoskeletal: Negative for back pain.  Endo/Heme/Allergies: Does not bruise/bleed easily.    Blood pressure 111/72, pulse 82, temperature 97 F (36.1 C), temperature source Oral, resp. rate 16, SpO2 100.00%. Physical Exam  Constitutional: She is oriented to person, place, and time. She appears well-developed  and well-nourished.  Cardiovascular: Normal rate and regular rhythm.   Respiratory: Effort normal and breath sounds normal.  GI: Soft. Bowel sounds are normal. There is no tenderness.  Musculoskeletal: Normal range of motion. She exhibits no edema.  Neurological: She is alert and oriented to person, place, and time.     Assessment/Plan Patient with history of stage 4 esophageal adenocarcinoma presents today for port a cath placement for chemotherapy. Details/risks of procedure d/w pt/husband with their understanding and consent.  Crissie Sickles Dajon Rowe 03/24/2014, 10:40 AM

## 2014-03-24 NOTE — Discharge Instructions (Signed)
Implanted Port Home Guide °An implanted port is a type of central line that is placed under the skin. Central lines are used to provide IV access when treatment or nutrition needs to be given through a person's veins. Implanted ports are used for long-term IV access. An implanted port may be placed because:  °· You need IV medicine that would be irritating to the small veins in your hands or arms.   °· You need long-term IV medicines, such as antibiotics.   °· You need IV nutrition for a long period.   °· You need frequent blood draws for lab tests.   °· You need dialysis.   °Implanted ports are usually placed in the chest area, but they can also be placed in the upper arm, the abdomen, or the leg. An implanted port has two main parts:  °· Reservoir. The reservoir is round and will appear as a small, raised area under your skin. The reservoir is the part where a needle is inserted to give medicines or draw blood.   °· Catheter. The catheter is a thin, flexible tube that extends from the reservoir. The catheter is placed into a large vein. Medicine that is inserted into the reservoir goes into the catheter and then into the vein.   °HOW WILL I CARE FOR MY INCISION SITE? °Do not get the incision site wet. Bathe or shower as directed by your health care provider.  °HOW IS MY PORT ACCESSED? °Special steps must be taken to access the port:  °· Before the port is accessed, a numbing cream can be placed on the skin. This helps numb the skin over the port site.   °· Your health care provider uses a sterile technique to access the port. °· Your health care provider must put on a mask and sterile gloves. °· The skin over your port is cleaned carefully with an antiseptic and allowed to dry. °· The port is gently pinched between sterile gloves, and a needle is inserted into the port. °· Only "non-coring" port needles should be used to access the port. Once the port is accessed, a blood return should be checked. This helps  ensure that the port is in the vein and is not clogged.   °· If your port needs to remain accessed for a constant infusion, a clear (transparent) bandage will be placed over the needle site. The bandage and needle will need to be changed every week, or as directed by your health care provider.   °· Keep the bandage covering the needle clean and dry. Do not get it wet. Follow your health care provider's instructions on how to take a shower or bath while the port is accessed.   °· If your port does not need to stay accessed, no bandage is needed over the port.   °WHAT IS FLUSHING? °Flushing helps keep the port from getting clogged. Follow your health care provider's instructions on how and when to flush the port. Ports are usually flushed with saline solution or a medicine called heparin. The need for flushing will depend on how the port is used.  °· If the port is used for intermittent medicines or blood draws, the port will need to be flushed:   °· After medicines have been given.   °· After blood has been drawn.   °· As part of routine maintenance.   °· If a constant infusion is running, the port may not need to be flushed.   °HOW LONG WILL MY PORT STAY IMPLANTED? °The port can stay in for as long as your health care   provider thinks it is needed. When it is time for the port to come out, surgery will be done to remove it. The procedure is similar to the one performed when the port was put in.  °WHEN SHOULD I SEEK IMMEDIATE MEDICAL CARE? °When you have an implanted port, you should seek immediate medical care if:  °· You notice a bad smell coming from the incision site.   °· You have swelling, redness, or drainage at the incision site.   °· You have more swelling or pain at the port site or the surrounding area.   °· You have a fever that is not controlled with medicine. °Document Released: 11/26/2005 Document Revised: 09/16/2013 Document Reviewed: 08/03/2013 °Exit[employer]® Patient Information ©2014 [employer]. °Moderate Sedation, Adult °Moderate sedation is given to help you relax or even sleep through a procedure. You may remain sleepy, be clumsy, or have poor balance for several hours following this procedure. Arrange for a responsible adult, family member, or friend to take you home. A responsible adult should stay with you for at least 24 hours or until the medicines have worn off. °· Do not participate in any activities where you could become injured for the next 24 hours, or until you feel normal again. Do not: °· Drive. °· Swim. °· Ride a bicycle. °· Operate heavy machinery. °· Cook. °· Use power tools. °· Climb ladders. °· Work at heights. °· Do not make important decisions or sign legal documents until you are improved. °· Vomiting may occur if you eat too soon. When you can drink without vomiting, try water, juice, or soup. Try solid foods if you feel little or no nausea. °· Only take over-the-counter or prescription medications for pain, discomfort, or fever as directed by your caregiver.If pain medications have been prescribed for you, ask your caregiver how soon it is safe to take them. °· Make sure you and your family fully understands everything about the medication given to you. Make sure you understand what side effects may occur. °· You should not drink alcohol, take sleeping pills, or medications that cause drowsiness for at least 24 hours. °· If you smoke, do not smoke alone. °· If you are feeling better, you may resume normal activities 24 hours after receiving sedation. °· Keep all appointments as scheduled. Follow all instructions. °· Ask questions if you do not understand. °SEEK MEDICAL CARE IF:  °· Your skin is pale or bluish in color. °· You continue to feel sick to your stomach (nauseous) or throw up (vomit). °· Your pain is getting worse and not helped by medication. °· You have bleeding or swelling. °· You are still sleepy or feeling clumsy after 24 hours. °SEEK IMMEDIATE MEDICAL CARE IF:   °· You develop a rash. °· You have difficulty breathing. °· You develop any type of allergic problem. °· You have a fever. °Document Released: 08/21/2001 Document Revised: 02/18/2012 Document Reviewed: 08/03/2013 °Exit[employer]® Patient Information ©2014 [employer]. ° °

## 2014-03-25 ENCOUNTER — Encounter (HOSPITAL_COMMUNITY): Payer: Self-pay

## 2014-03-25 ENCOUNTER — Other Ambulatory Visit: Payer: Self-pay

## 2014-03-25 ENCOUNTER — Encounter (HOSPITAL_BASED_OUTPATIENT_CLINIC_OR_DEPARTMENT_OTHER): Payer: 59

## 2014-03-25 VITALS — BP 110/70 | HR 91 | Wt 144.4 lb

## 2014-03-25 DIAGNOSIS — C159 Malignant neoplasm of esophagus, unspecified: Secondary | ICD-10-CM

## 2014-03-25 DIAGNOSIS — Z9181 History of falling: Secondary | ICD-10-CM

## 2014-03-25 DIAGNOSIS — R531 Weakness: Secondary | ICD-10-CM

## 2014-03-25 DIAGNOSIS — C155 Malignant neoplasm of lower third of esophagus: Secondary | ICD-10-CM

## 2014-03-25 HISTORY — DX: Weakness: R53.1

## 2014-03-25 NOTE — Progress Notes (Signed)
Chemo teaching done and consent signed for Oxaliplatin, Leucovorin, 5FU. Chemo calendar given. RX for shower chair/4 wheel rolling walker with chair given to patient. EKG done due to irregular heart beat. EKG showed NSR with sinus arrhythmia. Results to be given to [facility]. Elizabeth Moss said ok and that the EKG was normal.

## 2014-03-26 ENCOUNTER — Telehealth (HOSPITAL_COMMUNITY): Payer: Self-pay | Admitting: *Deleted

## 2014-03-26 ENCOUNTER — Other Ambulatory Visit (HOSPITAL_COMMUNITY): Payer: Self-pay | Admitting: Oncology

## 2014-03-26 DIAGNOSIS — C159 Malignant neoplasm of esophagus, unspecified: Secondary | ICD-10-CM

## 2014-03-26 MED ORDER — LIDOCAINE-PRILOCAINE 2.5-2.5 % EX CREA: 1.0000 "application " | TOPICAL_CREAM | CUTANEOUS | Status: AC | PRN

## 2014-03-26 NOTE — Telephone Encounter (Signed)
100% covered by [employer] per Corinne.

## 2014-03-29 ENCOUNTER — Encounter (HOSPITAL_BASED_OUTPATIENT_CLINIC_OR_DEPARTMENT_OTHER): Payer: 59

## 2014-03-29 ENCOUNTER — Encounter (HOSPITAL_COMMUNITY): Payer: Self-pay

## 2014-03-29 VITALS — BP 103/72 | HR 79 | Temp 97.2°F | Resp 18 | Wt 144.0 lb

## 2014-03-29 DIAGNOSIS — C155 Malignant neoplasm of lower third of esophagus: Secondary | ICD-10-CM

## 2014-03-29 DIAGNOSIS — Z5111 Encounter for antineoplastic chemotherapy: Secondary | ICD-10-CM

## 2014-03-29 DIAGNOSIS — E039 Hypothyroidism, unspecified: Secondary | ICD-10-CM

## 2014-03-29 DIAGNOSIS — C159 Malignant neoplasm of esophagus, unspecified: Secondary | ICD-10-CM

## 2014-03-29 DIAGNOSIS — C7970 Secondary malignant neoplasm of unspecified adrenal gland: Secondary | ICD-10-CM

## 2014-03-29 DIAGNOSIS — C778 Secondary and unspecified malignant neoplasm of lymph nodes of multiple regions: Secondary | ICD-10-CM

## 2014-03-29 DIAGNOSIS — E43 Unspecified severe protein-calorie malnutrition: Secondary | ICD-10-CM

## 2014-03-29 DIAGNOSIS — R918 Other nonspecific abnormal finding of lung field: Secondary | ICD-10-CM

## 2014-03-29 MED ORDER — SODIUM CHLORIDE 0.9 % IV SOLN
Freq: Once | INTRAVENOUS | Status: AC
  Administered 2014-03-29: 8 mg via INTRAVENOUS
  Filled 2014-03-29: qty 4

## 2014-03-29 MED ORDER — SODIUM CHLORIDE 0.9 % IV SOLN
2400.0000 mg/m2 | INTRAVENOUS | Status: DC
  Administered 2014-03-29: 4200 mg via INTRAVENOUS
  Filled 2014-03-29: qty 84

## 2014-03-29 MED ORDER — SODIUM CHLORIDE 0.9 % IJ SOLN: 10.0000 mL | INTRAMUSCULAR | Status: DC | PRN

## 2014-03-29 MED ORDER — FLUOROURACIL CHEMO INJECTION 2.5 GM/50ML
400.0000 mg/m2 | Freq: Once | INTRAVENOUS | Status: AC
  Administered 2014-03-29: 700 mg via INTRAVENOUS
  Filled 2014-03-29: qty 14

## 2014-03-29 MED ORDER — LEUCOVORIN CALCIUM INJECTION 350 MG
400.0000 mg/m2 | Freq: Once | INTRAMUSCULAR | Status: AC
  Administered 2014-03-29: 696 mg via INTRAVENOUS
  Filled 2014-03-29: qty 34.8

## 2014-03-29 MED ORDER — SODIUM CHLORIDE 0.9 % IV SOLN
Freq: Once | INTRAVENOUS | Status: AC
  Administered 2014-03-29: 10:00:00 via INTRAVENOUS

## 2014-03-29 MED ORDER — DEXTROSE 5 % IV SOLN
INTRAVENOUS | Status: DC
  Administered 2014-03-29: 11:00:00 via INTRAVENOUS

## 2014-03-29 MED ORDER — SODIUM CHLORIDE 0.9 % IV SOLN: 8.0000 mg | Freq: Once | INTRAVENOUS | Status: DC

## 2014-03-29 MED ORDER — METOCLOPRAMIDE HCL 5 MG/ML IJ SOLN
20.0000 mg | Freq: Four times a day (QID) | INTRAVENOUS | Status: DC
  Administered 2014-03-29: 20 mg via INTRAVENOUS
  Filled 2014-03-29 (×5): qty 4

## 2014-03-29 MED ORDER — HEPARIN SOD (PORK) LOCK FLUSH 100 UNIT/ML IV SOLN: 500.0000 [IU] | Freq: Once | INTRAVENOUS | Status: DC | PRN

## 2014-03-29 MED ORDER — OXALIPLATIN CHEMO INJECTION 100 MG/20ML
85.0000 mg/m2 | Freq: Once | INTRAVENOUS | Status: AC
  Administered 2014-03-29: 150 mg via INTRAVENOUS
  Filled 2014-03-29: qty 30

## 2014-03-29 MED ORDER — DEXAMETHASONE SODIUM PHOSPHATE 10 MG/ML IJ SOLN: 10.0000 mg | Freq: Once | INTRAMUSCULAR | Status: DC

## 2014-03-29 NOTE — Patient Instructions (Signed)
[facility] Discharge Instructions for Patients Receiving Chemotherapy  Today you received the following chemotherapy agents oxaliplatin, 39fu, leucovorin  To help prevent nausea and vomiting after your treatment, we encourage you to take your nausea medication  Take a oil retention enema for constipation. You can also continue stool softeners and laxatives.   If you develop nausea and vomiting that is not controlled by your nausea medication, call the clinic. If it is after clinic hours your family physician or the after hours number for the clinic or go to the Emergency Department.   BELOW ARE SYMPTOMS THAT SHOULD BE REPORTED IMMEDIATELY:  *FEVER GREATER THAN 101.0 F  *CHILLS WITH OR WITHOUT FEVER  NAUSEA AND VOMITING THAT IS NOT CONTROLLED WITH YOUR NAUSEA MEDICATION  *UNUSUAL SHORTNESS OF BREATH  *UNUSUAL BRUISING OR BLEEDING  TENDERNESS IN MOUTH AND THROAT WITH OR WITHOUT PRESENCE OF ULCERS  *URINARY PROBLEMS  *BOWEL PROBLEMS  UNUSUAL RASH Items with * indicate a potential emergency and should be followed up as soon as possible.  One of the nurses will contact you 24 hours after your treatment. Please let the nurse know about any problems that you may have experienced. Feel free to call the clinic you have any questions or concerns. The clinic phone number is [phone].

## 2014-03-29 NOTE — Progress Notes (Signed)
Tolerated chemo well. Attempted to eat at lunch, however regurgitation occurred about 45 min after eating. Consist of a lot of frothy material. Patient denies associated nausea.  Up to bathroom at end of transfusion. Slight wheezing noted on inspiration. O2sat 98%. Patient agreeable to stay 20 -30 min for monitoring. 1417 resting comfortably in w/c. No further complaints. Home with daughter.

## 2014-03-29 NOTE — Progress Notes (Signed)
Elizabeth Moss  OFFICE PROGRESS NOTE  [name], MD [address] [place] [address]  DIAGNOSIS: Esophageal cancer - Plan: CBC with Differential, Comprehensive metabolic panel  Protein-calorie malnutrition, severe  Hypothyroidism  Chief Complaint  Patient presents with  . Esophageal adenocarcinoma, HER-2/neu negative    CURRENT THERAPY: For initiation of FOLFOX therapy today.  INTERVAL HISTORY: Elizabeth Moss 58 y.o. female returns for initiation of chemotherapy for stage IV adenocarcinoma of the esophagus, HER-2/neu negative.  Over the past today she's had a problem with food regurgitation about 30 minutes after swallowing. She denies any hematemesis and has been constipated despite the use of stool softeners as well as Dulcolax. She denies any fever, chills, cough, wheezing, shortness of breath, abdominal distention, back pain, lower extremity swelling or redness, skin rash, or seizures.  MEDICAL HISTORY: Past Medical History  Diagnosis Date  . Hyperlipidemia   . Hypothyroidism   . Tobacco abuse   . Esophageal cancer   . Weakness 03/25/2014    INTERIM HISTORY: has Aortic ectasia, thoracic; Thoracic aortic ectasia; Right upper quadrant pain; CAP (community acquired pneumonia); Nausea & vomiting; Adrenal mass, right; Tobacco abuse; Poor dentition; Metabolic alkalosis; Hypothyroidism; Macrocytosis; Dysphagia; Protein-calorie malnutrition, severe; Esophageal cancer; Hx of fall; and Weakness on her problem list.    ALLERGIES:  is allergic to iohexol and tetracyclines & related.  MEDICATIONS: has a current medication list which includes the following prescription(s): atorvastatin, levothyroxine, lidocaine-prilocaine, metoclopramide, ondansetron, and pseudoephedrine-ibuprofen, and the following Facility-Administered Medications: dextrose, fluorouracil (ADRUCIL) 4,200 mg in sodium chloride 0.9 % 150 mL chemo  infusion, fluorouracil, heparin lock flush, leucovorin 696 mg in dextrose 5 % 250 mL infusion, metoCLOPramide (REGLAN) 20 mg in dextrose 5 % 50 mL IVPB, oxaliplatin (ELOXATIN) 150 mg in dextrose 5 % 500 mL chemo infusion, and sodium chloride.  SURGICAL HISTORY:  Past Surgical History  Procedure Laterality Date  . Abdominal hysterectomy  2000  . Cholecystectomy  1986  . Mandible surgery      lower and upper  . Cesarean section    . Dilation and curettage of uterus    . Esophagogastroduodenoscopy (egd) with propofol N/A 03/01/2014    Procedure: ESOPHAGOGASTRODUODENOSCOPY (EGD) WITH PROPOFOL;  Surgeon: Wonda Horner, MD;  Location: [facility];  Service: Endoscopy;  Laterality: N/A;    FAMILY HISTORY: family history includes Hyperlipidemia in her mother; Other in her mother.  SOCIAL HISTORY:  reports that she has been smoking Cigarettes.  She has a 17.5 pack-year smoking history. She has never used smokeless tobacco. She reports that she does not drink alcohol or use illicit drugs.  REVIEW OF SYSTEMS:  Other than that discussed above is noncontributory.  PHYSICAL EXAMINATION: ECOG PERFORMANCE STATUS: 1 - Symptomatic but completely ambulatory  There were no vitals taken for this visit.  GENERAL:alert, no distress and comfortable SKIN: skin color, texture, turgor are normal, no rashes or significant lesions EYES: PERLA; Conjunctiva are pink and non-injected, sclera clear SINUSES: No redness or tenderness over maxillary or ethmoid sinuses OROPHARYNX:no exudate, no erythema on lips, buccal mucosa, or tongue. NECK: supple, thyroid normal size, non-tender, without nodularity. No masses CHEST: Normal AP diameter with light port in place. LYMPH:  no palpable lymphadenopathy in the cervical, axillary or inguinal LUNGS: clear to auscultation and percussion with normal breathing effort HEART: regular rate & rhythm and no murmurs. ABDOMEN:abdomen soft, non-tender and normal bowel sounds. No  distention. No  organomegaly, ascites, or CVA tenderness. MUSCULOSKELETAL:no cyanosis of digits and no clubbing. Range of motion normal.  NEURO: alert & oriented x 3 with fluent speech, no focal motor/sensory deficits. 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   LABORATORY DATA: Hospital Outpatient Visit on 03/24/2014  Component Date Value Ref Range Status  . WBC 03/24/2014 6.4  4.0 - 10.5 K/uL Final  . RBC 03/24/2014 4.34  3.87 - 5.11 MIL/uL Final  . Hemoglobin 03/24/2014 15.1* 12.0 - 15.0 g/dL Final  . HCT 03/24/2014 45.4  36.0 - 46.0 % Final  . MCV 03/24/2014 104.6* 78.0 - 100.0 fL Final  . MCH 03/24/2014 34.8* 26.0 - 34.0 pg Final  . MCHC 03/24/2014 33.3  30.0 - 36.0 g/dL Final  . RDW 03/24/2014 14.7  11.5 - 15.5 % Final  . Platelets 03/24/2014 197  150 - 400 K/uL Final  . Neutrophils Relative % 03/24/2014 67  43 - 77 % Final  . Neutro Abs 03/24/2014 4.3  1.7 - 7.7 K/uL Final  . Lymphocytes Relative 03/24/2014 20  12 - 46 % Final  . Lymphs Abs 03/24/2014 1.3  0.7 - 4.0 K/uL Final  . Monocytes Relative 03/24/2014 9  3 - 12 % Final  . Monocytes Absolute 03/24/2014 0.6  0.1 - 1.0 K/uL Final  . Eosinophils Relative 03/24/2014 4  0 - 5 % Final  . Eosinophils Absolute 03/24/2014 0.2  0.0 - 0.7 K/uL Final  . Basophils Relative 03/24/2014 0  0 - 1 % Final  . Basophils Absolute 03/24/2014 0.0  0.0 - 0.1 K/uL Final  . Prothrombin Time 03/24/2014 12.3  11.6 - 15.2 seconds Final  . INR 03/24/2014 0.93  0.00 - 1.49 Final  Office Visit on 03/17/2014  Component Date Value Ref Range Status  . WBC 03/17/2014 7.5  4.0 - 10.5 K/uL Final  . RBC 03/17/2014 4.30  3.87 - 5.11 MIL/uL Final  . Hemoglobin 03/17/2014 15.2* 12.0 - 15.0 g/dL Final  . HCT 03/17/2014 46.4* 36.0 - 46.0 % Final  . MCV 03/17/2014 107.9* 78.0 - 100.0 fL Final  . MCH 03/17/2014 35.3* 26.0 - 34.0 pg Final  . MCHC 03/17/2014 32.8  30.0 - 36.0 g/dL Final  . RDW 03/17/2014 15.1  11.5 - 15.5 % Final  . Platelets 03/17/2014 226   150 - 400 K/uL Final  . Neutrophils Relative % 03/17/2014 73  43 - 77 % Final  . Neutro Abs 03/17/2014 5.4  1.7 - 7.7 K/uL Final  . Lymphocytes Relative 03/17/2014 17  12 - 46 % Final  . Lymphs Abs 03/17/2014 1.3  0.7 - 4.0 K/uL Final  . Monocytes Relative 03/17/2014 7  3 - 12 % Final  . Monocytes Absolute 03/17/2014 0.6  0.1 - 1.0 K/uL Final  . Eosinophils Relative 03/17/2014 3  0 - 5 % Final  . Eosinophils Absolute 03/17/2014 0.2  0.0 - 0.7 K/uL Final  . Basophils Relative 03/17/2014 0  0 - 1 % Final  . Basophils Absolute 03/17/2014 0.0  0.0 - 0.1 K/uL Final  . Sodium 03/17/2014 140  137 - 147 mEq/L Final  . Potassium 03/17/2014 4.2  3.7 - 5.3 mEq/L Final  . Chloride 03/17/2014 99  96 - 112 mEq/L Final  . CO2 03/17/2014 32  19 - 32 mEq/L Final  . Glucose, Bld 03/17/2014 113* 70 - 99 mg/dL Final  . BUN 03/17/2014 7  6 - 23 mg/dL Final  . Creatinine, Ser 03/17/2014 0.73  0.50 - 1.10 mg/dL Final  .  Calcium 03/17/2014 10.1  8.4 - 10.5 mg/dL Final  . Total Protein 03/17/2014 6.8  6.0 - 8.3 g/dL Final  . Albumin 03/17/2014 2.8* 3.5 - 5.2 g/dL Final  . AST 03/17/2014 20  0 - 37 U/L Final  . ALT 03/17/2014 12  0 - 35 U/L Final  . Alkaline Phosphatase 03/17/2014 125* 39 - 117 U/L Final  . Total Bilirubin 03/17/2014 0.6  0.3 - 1.2 mg/dL Final  . GFR calc non Af Amer 03/17/2014 >90  >90 mL/min Final  . GFR calc Af Amer 03/17/2014 >90  >90 mL/min Final   Comment: (NOTE)                          The eGFR has been calculated using the CKD EPI equation.                          This calculation has not been validated in all clinical situations.                          eGFR's persistently <90 mL/min signify possible Chronic Kidney                          Disease.  . CEA 03/17/2014 27.1* 0.0 - 5.0 ng/mL Final   Performed at [facility]  . CA 19-9 03/17/2014 20.5* <35.0 U/mL Final   Performed at [facility]  . Ferritin 03/17/2014 125  10 - 291 ng/mL Final   Performed at  [facility] Outpatient Visit on 03/15/2014  Component Date Value Ref Range Status  . Glucose-Capillary 03/15/2014 81  70 - 99 mg/dL Final  Admission on 02/27/2014, Discharged on 03/03/2014  Component Date Value Ref Range Status  . WBC 02/27/2014 6.7  4.0 - 10.5 K/uL Final  . RBC 02/27/2014 4.49  3.87 - 5.11 MIL/uL Final  . Hemoglobin 02/27/2014 16.2* 12.0 - 15.0 g/dL Final  . HCT 02/27/2014 49.5* 36.0 - 46.0 % Final  . MCV 02/27/2014 110.2* 78.0 - 100.0 fL Final  . MCH 02/27/2014 36.1* 26.0 - 34.0 pg Final  . MCHC 02/27/2014 32.7  30.0 - 36.0 g/dL Final  . RDW 02/27/2014 14.6  11.5 - 15.5 % Final  . Platelets 02/27/2014 195  150 - 400 K/uL Final  . Neutrophils Relative % 02/27/2014 77  43 - 77 % Final  . Lymphocytes Relative 02/27/2014 13  12 - 46 % Final  . Monocytes Relative 02/27/2014 8  3 - 12 % Final  . Eosinophils Relative 02/27/2014 2  0 - 5 % Final  . Basophils Relative 02/27/2014 0  0 - 1 % Final  . Neutro Abs 02/27/2014 5.2  1.7 - 7.7 K/uL Final  . Lymphs Abs 02/27/2014 0.9  0.7 - 4.0 K/uL Final  . Monocytes Absolute 02/27/2014 0.5  0.1 - 1.0 K/uL Final  . Eosinophils Absolute 02/27/2014 0.1  0.0 - 0.7 K/uL Final  . Basophils Absolute 02/27/2014 0.0  0.0 - 0.1 K/uL Final  . Sodium 02/27/2014 142  137 - 147 mEq/L Final  . Potassium 02/27/2014 3.4* 3.7 - 5.3 mEq/L Final  . Chloride 02/27/2014 95* 96 - 112 mEq/L Final  . CO2 02/27/2014 39* 19 - 32 mEq/L Final  . Glucose, Bld 02/27/2014 116* 70 - 99 mg/dL Final  . BUN 02/27/2014 7  6 -  23 mg/dL Final  . Creatinine, Ser 02/27/2014 0.67  0.50 - 1.10 mg/dL Final  . Calcium 02/27/2014 10.3  8.4 - 10.5 mg/dL Final  . Total Protein 02/27/2014 7.4  6.0 - 8.3 g/dL Final  . Albumin 02/27/2014 3.0* 3.5 - 5.2 g/dL Final  . AST 02/27/2014 23  0 - 37 U/L Final  . ALT 02/27/2014 28  0 - 35 U/L Final  . Alkaline Phosphatase 02/27/2014 182* 39 - 117 U/L Final  . Total Bilirubin 02/27/2014 0.9  0.3 - 1.2 mg/dL Final  .  GFR calc non Af Amer 02/27/2014 >90  >90 mL/min Final  . GFR calc Af Amer 02/27/2014 >90  >90 mL/min Final   Comment: (NOTE)                          The eGFR has been calculated using the CKD EPI equation.                          This calculation has not been validated in all clinical situations.                          eGFR's persistently <90 mL/min signify possible Chronic Kidney                          Disease.  Marland Kitchen Specimen Description 02/27/2014 LEFT ANTECUBITAL   Final  . Special Requests 02/27/2014 BOTTLES DRAWN AEROBIC AND ANAEROBIC 10CC   Final  . Culture 02/27/2014 NO GROWTH 5 DAYS   Final  . Report Status 02/27/2014 03/04/2014 FINAL   Final  . Specimen Description 02/27/2014 RIGHT ANTECUBITAL   Final  . Special Requests 02/27/2014 BOTTLES DRAWN AEROBIC AND ANAEROBIC 8CC   Final  . Culture 02/27/2014 NO GROWTH 5 DAYS   Final  . Report Status 02/27/2014 03/04/2014 FINAL   Final  . Lactic Acid, Venous 02/27/2014 1.6  0.5 - 2.2 mmol/L Final  . Magnesium 02/27/2014 2.3  1.5 - 2.5 mg/dL Final  . TSH 02/28/2014 0.243* 0.350 - 4.500 uIU/mL Final   Performed at [facility]  . Sodium 02/28/2014 143  137 - 147 mEq/L Final  . Potassium 02/28/2014 3.8  3.7 - 5.3 mEq/L Final  . Chloride 02/28/2014 101  96 - 112 mEq/L Final  . CO2 02/28/2014 32  19 - 32 mEq/L Final  . Glucose, Bld 02/28/2014 92  70 - 99 mg/dL Final  . BUN 02/28/2014 4* 6 - 23 mg/dL Final  . Creatinine, Ser 02/28/2014 0.69  0.50 - 1.10 mg/dL Final  . Calcium 02/28/2014 9.4  8.4 - 10.5 mg/dL Final  . Total Protein 02/28/2014 5.6* 6.0 - 8.3 g/dL Final  . Albumin 02/28/2014 2.4* 3.5 - 5.2 g/dL Final  . AST 02/28/2014 17  0 - 37 U/L Final  . ALT 02/28/2014 20  0 - 35 U/L Final  . Alkaline Phosphatase 02/28/2014 133* 39 - 117 U/L Final  . Total Bilirubin 02/28/2014 0.7  0.3 - 1.2 mg/dL Final  . GFR calc non Af Amer 02/28/2014 >90  >90 mL/min Final  . GFR calc Af Amer 02/28/2014 >90  >90 mL/min Final   Comment:  (NOTE)                          The eGFR has been calculated using the  CKD EPI equation.                          This calculation has not been validated in all clinical situations.                          eGFR's persistently <90 mL/min signify possible Chronic Kidney                          Disease.  . WBC 02/28/2014 4.7  4.0 - 10.5 K/uL Final  . RBC 02/28/2014 3.93  3.87 - 5.11 MIL/uL Final  . Hemoglobin 02/28/2014 13.7  12.0 - 15.0 g/dL Final  . HCT 02/28/2014 42.9  36.0 - 46.0 % Final  . MCV 02/28/2014 109.2* 78.0 - 100.0 fL Final  . MCH 02/28/2014 34.9* 26.0 - 34.0 pg Final  . MCHC 02/28/2014 31.9  30.0 - 36.0 g/dL Final  . RDW 02/28/2014 14.5  11.5 - 15.5 % Final  . Platelets 02/28/2014 187  150 - 400 K/uL Final  . Vitamin B-12 02/28/2014 1215* 211 - 911 pg/mL Final   Performed at [facility]  . MRSA by PCR 03/01/2014 NEGATIVE  NEGATIVE Final   Comment:                                 The GeneXpert MRSA Assay (FDA                          approved for NASAL specimens                          only), is one component of a                          comprehensive MRSA colonization                          surveillance program. It is not                          intended to diagnose MRSA                          infection nor to guide or                          monitor treatment for                          MRSA infections.    PATHOLOGY: Adenocarcinoma, HER-2/neu negative.  Urinalysis    Component Value Date/Time   COLORURINE YELLOW 02/01/2014 0354   APPEARANCEUR CLEAR 02/01/2014 0354   LABSPEC 1.010 02/01/2014 0354   PHURINE 7.0 02/01/2014 0354   GLUCOSEU NEGATIVE 02/01/2014 0354   HGBUR NEGATIVE 02/01/2014 0354   BILIRUBINUR NEGATIVE 02/01/2014 0354   KETONESUR NEGATIVE 02/01/2014 0354   PROTEINUR NEGATIVE 02/01/2014 0354   UROBILINOGEN 0.2 02/01/2014 0354   NITRITE NEGATIVE 02/01/2014 0354   LEUKOCYTESUR NEGATIVE 02/01/2014 0354    RADIOGRAPHIC STUDIES: Ct Chest Wo  Contrast  02/27/2014   CLINICAL DATA:  Chest pain.  Bibasilar infiltrates. Severe contrast allergy.  EXAM: CT CHEST WITHOUT CONTRAST  TECHNIQUE: Multidetector CT imaging of the chest was performed following the standard protocol without IV contrast.  COMPARISON:  11/03/2012  FINDINGS: Fluid-filled upper thoracic esophagus is seen with a masslike density in the lower thoracic esophagus measuring 2.8 x 3.7 cm on image 32. This is suspicious for esophageal carcinoma. New high right paratracheal mediastinal adenopathy is seen measuring 1.7 cm on image 8, consistent with metastatic disease and there also tiny less than 1 cm right supraclavicular lymph nodes, which are new since previous study.  An 11 mm pulmonary nodule is seen in the right middle lobe on image 29 and a 6 mm nodule is seen in central left lower lobe on image 33, highly suspicious for pulmonary metastases. A few other tiny scattered less than 5 mm right lung nodules are seen which are nonspecific on images 11 and 21.  Patchy airspace opacity is seen in the right lung base, suspicious for pneumonia or aspiration. Mild atelectasis or scarring noted in the left lung base. No evidence of pleural effusion.  Small lymph nodes are seen in the gastric patent ligament measuring up to 9 mm, suspicious for early lymph node metastases. A 1.9 cm right adrenal mass is also seen which is new and consistent with adrenal metastasis. No suspicious bone lesions identified  IMPRESSION: Distal esophageal masslike density measuring 3.7 cm, suspicious for primary esophageal carcinoma. Consider endoscopy for further evaluation, as well as PET-CT scan for staging.  Right paratracheal lymphadenopathy, consistent with metastatic disease. Shotty less than 1 cm lymph nodes in the gastrohepatic ligament and right supraclavicular region are also suspicious for early lymph node metastases.  Small bilateral pulmonary metastases, as described above.  Right lower lung airspace disease,  suspicious for pneumonia or aspiration. Mild left basilar atelectasis or scarring.  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   Electronically Signed   By: Earle Gell M.D.   On: 02/27/2014 14:50   Nm Pet Image Initial (pi) Skull Base To Thigh  03/15/2014   CLINICAL DATA:  Initial treatment strategy for esophageal carcinoma.  EXAM: NUCLEAR MEDICINE PET SKULL BASE TO THIGH  TECHNIQUE: 7.7 mCi F-18 FDG was injected intravenously. Full-ring PET imaging was performed from the skull base to thigh after the radiotracer. CT data was obtained and used for attenuation correction and anatomic localization.  FASTING BLOOD GLUCOSE:  Value: 81 mg/dl  COMPARISON:  CT CHEST W/O CM dated 02/27/2014; CT ABDOMEN W/O CM dated 02/26/2014  FINDINGS: NECK  No hypermetabolic lymph nodes in the neck. CT images show no acute findings.  CHEST  A 1.8 cm high right paratracheal lymph node has an SUV max of 8.2. Left subcarinal lymph node measures 1.4 cm with an SUV max of 10.2.  Focal hypermetabolism is seen in the upper esophagus (CT image 48 and PET image 49). Mid esophageal mass measures 3.0 x 3.8 cm with an SUV max of 29.1. Abnormal hypermetabolism extends inferiorly to just above the gastroesophageal junction. Hypermetabolic lymph nodes are seen along the lateral margin of the mid descending thoracic aorta (PET image 56).  A subpleural nodule in the medial aspect of the right upper lobe measures 1.0 x 1.4 cm with an SUV max of 10.4 (CT series 8, image 34 and PET image 57). An ovoid lesion in the lateral aspect of the inferior right lower lobe measures approximately 1.0 x 2.8 cm (CT series 8, image 49) with an SUV max of  3.9.  A soft tissue nodule in the left lateral paraspinous musculature measures 1.1 x 1.8 cm and has an SUV max of 7.1 (CT image 58 and PET image 59).  CT images show dilatation of the proximal esophagus, indicative of a component of obstruction. Heart size normal. No pericardial effusion. There is  volume loss in the right lower lobe, adjacent to the right hemidiaphragm. Scattered pulmonary parenchymal scarring. 5 mm right upper lobe nodule (series 8, image 26), as on 02/27/2014. 6 mm left lower lobe nodule (series 8, image 47), stable. The sub cm pulmonary nodules are too small for PET resolution. No pleural fluid.  ABDOMEN/PELVIS  No abnormal hypermetabolism in the liver. Right adrenal nodule measures 1.7 x 2.3 cm with an SUV max of 9.7. Gastrohepatic ligament lymph nodes measure up to 13 mm in short axis with an SUV max of 19.4 (CT image 85 and PET image 84).  No abnormal hypermetabolism within the spleen or pancreas. No additional hypermetabolic lymph nodes.  There is focal hypermetabolism within the musculature between the right L1 and L2 transverse processes (CT image 102) without a definite CT correlate. A soft tissue nodule in the left gluteus musculature measures 15 mm with an SUV max of 7.8 (CT and PET image 171). No additional areas of abnormal hypermetabolism in the abdomen or pelvis.  CT images show the liver to be grossly unremarkable. Cholecystectomy. Left adrenal gland and right kidney are unremarkable. 1.3 cm low-attenuation exophytic lesion off the left kidney is unchanged. Spleen, pancreas, stomach and bowel are grossly unremarkable. Hysterectomy. Ovaries are visualized. No free fluid.  SKELETON  No hypermetabolic osseous lesions.  IMPRESSION: 1. Primary esophageal carcinoma with metastatic disease involving mediastinal, subcarinal, descending periaortic and gastrohepatic ligament lymph nodes. Additional metastatic disease is seen in the right upper lobe, right adrenal gland, left paraspinal musculature and left gluteus musculature. 2. Mildly hypermetabolic ovoid subpleural opacity in the right lower lobe, indeterminate. Continued attention on followup exams is warranted. 3. Additional scattered pulmonary nodules are too small for PET resolution.   Electronically Signed   By: Lorin Picket  M.D.   On: 03/15/2014 17:02   Ir Fluoro Guide Cv Line Right  03/24/2014   CLINICAL DATA:  Esophageal cancer  EXAM: TUNNEL POWER PORT PLACEMENT WITH SUBCUTANEOUS POCKET UTILIZING ULTRASOUND & FLOUROSCOPY  FLUOROSCOPY TIME:  12 seconds.  MEDICATIONS AND MEDICAL HISTORY: Versed 2 mg, Fentanyl 50 mcg.  As antibiotic prophylaxis, Ancef was ordered pre-procedure and administered intravenously within one hour of incision.  ANESTHESIA/SEDATION: Moderate sedation time: 25 minutes  CONTRAST:  None  PROCEDURE: After written informed consent was obtained, patient was placed in the supine position on angiographic table. The right neck and chest was prepped and draped in a sterile fashion. Lidocaine was utilized for local anesthesia. The right internal jugular vein was noted to be patent initially with ultrasound. Under sonographic guidance, a micropuncture needle was inserted into the right IJ vein (Ultrasound and fluoroscopic image documentation was performed). The needle was removed over an 018 wire which was exchanged for a Amplatz. This was advanced into the IVC. An 8-French dilator was advanced over the Amplatz.  A small incision was made in the right upper chest over the anterior right second rib. Utilizing blunt dissection, a subcutaneous pocket was created in the caudal direction. The pocket was irrigated with a copious amount of sterile normal saline. The port catheter was tunneled from the chest incision, and out the neck incision. The reservoir was inserted into the  subcutaneous pocket and secured with two 3-0 Ethilon stitches. A peel-away sheath was advanced over the Amplatz wire. The port catheter was cut to measure length and inserted through the peel-away sheath. The peel-away sheath was removed. The chest incision was closed with 3-0 Vicryl interrupted stitches for the subcutaneous tissue and a running of 4-0 Vicryl subcuticular stitch for the skin. The neck incision was closed with a 4-0 Vicryl subcuticular  stitch. Derma-bond was applied to both surgical incisions. The port reservoir was flushed and instilled with heparinized saline. No complications.  FINDINGS: A right IJ vein Port-A-Cath is in place with its tip at the cavoatrial junction.  IMPRESSION: Successful 8 French right internal jugular vein power port placement with its tip at the SVC/RA junction.   Electronically Signed   By: Maryclare Bean M.D.   On: 03/24/2014 14:08   Ir US Guide Vasc Access Right  03/24/2014   CLINICAL DATA:  Esophageal cancer  EXAM: TUNNEL POWER PORT PLACEMENT WITH SUBCUTANEOUS POCKET UTILIZING ULTRASOUND & FLOUROSCOPY  FLUOROSCOPY TIME:  12 seconds.  MEDICATIONS AND MEDICAL HISTORY: Versed 2 mg, Fentanyl 50 mcg.  As antibiotic prophylaxis, Ancef was ordered pre-procedure and administered intravenously within one hour of incision.  ANESTHESIA/SEDATION: Moderate sedation time: 25 minutes  CONTRAST:  None  PROCEDURE: After written informed consent was obtained, patient was placed in the supine position on angiographic table. The right neck and chest was prepped and draped in a sterile fashion. Lidocaine was utilized for local anesthesia. The right internal jugular vein was noted to be patent initially with ultrasound. Under sonographic guidance, a micropuncture needle was inserted into the right IJ vein (Ultrasound and fluoroscopic image documentation was performed). The needle was removed over an 018 wire which was exchanged for a Amplatz. This was advanced into the IVC. An 8-French dilator was advanced over the Amplatz.  A small incision was made in the right upper chest over the anterior right second rib. Utilizing blunt dissection, a subcutaneous pocket was created in the caudal direction. The pocket was irrigated with a copious amount of sterile normal saline. The port catheter was tunneled from the chest incision, and out the neck incision. The reservoir was inserted into the subcutaneous pocket and secured with two 3-0 Ethilon  stitches. A peel-away sheath was advanced over the Amplatz wire. The port catheter was cut to measure length and inserted through the peel-away sheath. The peel-away sheath was removed. The chest incision was closed with 3-0 Vicryl interrupted stitches for the subcutaneous tissue and a running of 4-0 Vicryl subcuticular stitch for the skin. The neck incision was closed with a 4-0 Vicryl subcuticular stitch. Derma-bond was applied to both surgical incisions. The port reservoir was flushed and instilled with heparinized saline. No complications.  FINDINGS: A right IJ vein Port-A-Cath is in place with its tip at the cavoatrial junction.  IMPRESSION: Successful 8 French right internal jugular vein power port placement with its tip at the SVC/RA junction.   Electronically Signed   By: Maryclare Bean M.D.   On: 03/24/2014 14:08    ASSESSMENT:  #1. Stage IV adenocarcinoma of esophagus with mediastinal, subcarinal, descending periaortic, gastrohepatic ligament lymph node metastases in addition to right upper lobe, right adrenal, left paraspinal musculature, and left gluteus musculature metastases. #2. Bilateral pulmonary nodules, too small to elucidate on PET scan. #3. Hypothyroidism, on treatment. #4. 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 #5. Esophageal dysmotility.   PLAN:  #1. FOLFOX chemotherapy  cycle 1 to begin today. #2. Reglan 20 mg intravenously to see whether or not regurgitation can be better controlled with higher dose agent. #3. Fleet oil retention enema to begin bowel movements followed by Dulcolax every night. The patient does not take any chronic analgesics. #4. Followup in one week with CBC and chem profile.   All questions were answered. The patient knows to call the clinic with any problems, questions or concerns. We can certainly see the patient much sooner if necessary.   I spent 25 minutes counseling the patient face to face. The total time spent  in the appointment was 30 minutes.    Farrel Gobble, MD 03/29/2014 10:32 AM  DISCLAIMER:  This note was dictated with voice recognition software.  Similar sounding words can inadvertently be transcribed inaccurately and may not be corrected upon review.

## 2014-03-31 ENCOUNTER — Encounter (HOSPITAL_BASED_OUTPATIENT_CLINIC_OR_DEPARTMENT_OTHER): Payer: 59

## 2014-03-31 DIAGNOSIS — C159 Malignant neoplasm of esophagus, unspecified: Secondary | ICD-10-CM

## 2014-03-31 DIAGNOSIS — Z452 Encounter for adjustment and management of vascular access device: Secondary | ICD-10-CM

## 2014-03-31 DIAGNOSIS — C155 Malignant neoplasm of lower third of esophagus: Secondary | ICD-10-CM

## 2014-03-31 DIAGNOSIS — E43 Unspecified severe protein-calorie malnutrition: Secondary | ICD-10-CM

## 2014-03-31 MED ORDER — HEPARIN SOD (PORK) LOCK FLUSH 100 UNIT/ML IV SOLN
INTRAVENOUS | Status: AC
  Filled 2014-03-31: qty 5

## 2014-03-31 MED ORDER — HEPARIN SOD (PORK) LOCK FLUSH 100 UNIT/ML IV SOLN
500.0000 [IU] | Freq: Once | INTRAVENOUS | Status: AC | PRN
  Administered 2014-03-31: 500 [IU]

## 2014-03-31 MED ORDER — SODIUM CHLORIDE 0.9 % IJ SOLN
10.0000 mL | INTRAMUSCULAR | Status: DC | PRN
  Administered 2014-03-31: 10 mL

## 2014-03-31 NOTE — Progress Notes (Signed)
D/C continuous infusion pump. Flushed port per protocol, de-accessed. Pt reports to MD that she has less production of the frothy phlegm. He informed her that is due to the chemotherapy taking effect. She reports being tired and sleeps a lot. She denies N&V, no fever or chills. Still not eating well but doctor informs her that will improve later.

## 2014-04-05 ENCOUNTER — Telehealth (HOSPITAL_COMMUNITY): Payer: Self-pay | Admitting: Hematology and Oncology

## 2014-04-05 NOTE — Telephone Encounter (Signed)
PC TO [facility] [place] ?ED IF [facility] [phone] REQUIRED AUTH PER [facility] AUTH IS NOT RQURD BUT MUST MEET [facility] GUIDELINES PT HAS $17 COPY OF OV CHEMO IS PAID @100 % OF ALLOWABLE CALL [phone]

## 2014-04-06 ENCOUNTER — Encounter (HOSPITAL_BASED_OUTPATIENT_CLINIC_OR_DEPARTMENT_OTHER): Payer: 59

## 2014-04-06 ENCOUNTER — Encounter (HOSPITAL_COMMUNITY): Payer: Self-pay

## 2014-04-06 ENCOUNTER — Telehealth (HOSPITAL_COMMUNITY): Payer: Self-pay

## 2014-04-06 VITALS — BP 64/39 | HR 91 | Temp 97.3°F | Resp 18 | Wt 137.7 lb

## 2014-04-06 DIAGNOSIS — R5383 Other fatigue: Secondary | ICD-10-CM

## 2014-04-06 DIAGNOSIS — C159 Malignant neoplasm of esophagus, unspecified: Secondary | ICD-10-CM

## 2014-04-06 DIAGNOSIS — K224 Dyskinesia of esophagus: Secondary | ICD-10-CM

## 2014-04-06 DIAGNOSIS — R197 Diarrhea, unspecified: Secondary | ICD-10-CM

## 2014-04-06 DIAGNOSIS — R5381 Other malaise: Secondary | ICD-10-CM

## 2014-04-06 DIAGNOSIS — J019 Acute sinusitis, unspecified: Secondary | ICD-10-CM

## 2014-04-06 DIAGNOSIS — C155 Malignant neoplasm of lower third of esophagus: Secondary | ICD-10-CM

## 2014-04-06 LAB — CBC WITH DIFFERENTIAL/PLATELET
BASOS PCT: 0 % (ref 0–1)
Basophils Absolute: 0 10*3/uL (ref 0.0–0.1)
Eosinophils Absolute: 0.2 10*3/uL (ref 0.0–0.7)
Eosinophils Relative: 4 % (ref 0–5)
HEMATOCRIT: 43.9 % (ref 36.0–46.0)
HEMOGLOBIN: 14.7 g/dL (ref 12.0–15.0)
LYMPHS PCT: 21 % (ref 12–46)
Lymphs Abs: 1 10*3/uL (ref 0.7–4.0)
MCH: 34.6 pg — ABNORMAL HIGH (ref 26.0–34.0)
MCHC: 33.5 g/dL (ref 30.0–36.0)
MCV: 103.3 fL — ABNORMAL HIGH (ref 78.0–100.0)
MONO ABS: 0.1 10*3/uL (ref 0.1–1.0)
MONOS PCT: 2 % — AB (ref 3–12)
NEUTROS PCT: 73 % (ref 43–77)
Neutro Abs: 3.5 10*3/uL (ref 1.7–7.7)
Platelets: 62 10*3/uL — ABNORMAL LOW (ref 150–400)
RBC: 4.25 MIL/uL (ref 3.87–5.11)
RDW: 14.3 % (ref 11.5–15.5)
Smear Review: DECREASED
WBC: 4.8 10*3/uL (ref 4.0–10.5)

## 2014-04-06 LAB — COMPREHENSIVE METABOLIC PANEL
ALK PHOS: 94 U/L (ref 39–117)
ALT: 30 U/L (ref 0–35)
AST: 36 U/L (ref 0–37)
Albumin: 2.9 g/dL — ABNORMAL LOW (ref 3.5–5.2)
BUN: 8 mg/dL (ref 6–23)
CHLORIDE: 98 meq/L (ref 96–112)
CO2: 31 meq/L (ref 19–32)
Calcium: 9.6 mg/dL (ref 8.4–10.5)
Creatinine, Ser: 0.67 mg/dL (ref 0.50–1.10)
GFR calc Af Amer: >90 mL/min (ref >90)
GLUCOSE: 77 mg/dL (ref 70–99)
POTASSIUM: 3.2 meq/L — AB (ref 3.7–5.3)
SODIUM: 141 meq/L (ref 137–147)
TOTAL PROTEIN: 6.3 g/dL (ref 6.0–8.3)
Total Bilirubin: 0.7 mg/dL (ref 0.3–1.2)

## 2014-04-06 MED ORDER — LORATADINE-PSEUDOEPHEDRINE ER 10-240 MG PO TB24: 1.0000 | ORAL_TABLET | Freq: Every day | ORAL | Status: AC

## 2014-04-06 MED ORDER — DEXTROSE 5 % IV SOLN
2.0000 g | Freq: Once | INTRAVENOUS | Status: AC
  Administered 2014-04-06: 2 g via INTRAVENOUS
  Filled 2014-04-06: qty 2

## 2014-04-06 MED ORDER — SODIUM CHLORIDE 0.9 % IJ SOLN
10.0000 mL | INTRAMUSCULAR | Status: DC | PRN
Start: 2014-04-06 — End: 2014-08-06
  Administered 2014-04-06: 10 mL

## 2014-04-06 MED ORDER — HEPARIN SOD (PORK) LOCK FLUSH 100 UNIT/ML IV SOLN
INTRAVENOUS | Status: AC
  Filled 2014-04-06: qty 5

## 2014-04-06 MED ORDER — AZITHROMYCIN 250 MG PO TABS: ORAL_TABLET | ORAL | Status: AC

## 2014-04-06 MED ORDER — SODIUM CHLORIDE 0.9 % IV SOLN
Freq: Once | INTRAVENOUS | Status: AC
  Administered 2014-04-06: 10:00:00 via INTRAVENOUS

## 2014-04-06 MED ORDER — SODIUM CHLORIDE 0.9 % IV SOLN
8.0000 mg | Freq: Once | INTRAVENOUS | Status: AC
  Administered 2014-04-06: 8 mg via INTRAVENOUS
  Filled 2014-04-06: qty 4

## 2014-04-06 MED ORDER — HEPARIN SOD (PORK) LOCK FLUSH 100 UNIT/ML IV SOLN
250.0000 [IU] | Freq: Once | INTRAVENOUS | Status: AC | PRN
  Administered 2014-04-06: 500 [IU]

## 2014-04-06 MED ORDER — POTASSIUM CHLORIDE 10 MEQ/100ML IV SOLN
10.0000 meq | Freq: Once | INTRAVENOUS | Status: AC
  Administered 2014-04-06: 10 meq via INTRAVENOUS
  Filled 2014-04-06: qty 100

## 2014-04-06 NOTE — Telephone Encounter (Signed)
Message from Mr. Zamudio stating that Elizabeth Moss is still having nausea & diarrhea and wants to know what to do.  Call to [facility] and discussed what she was taking.  Encouraged to use the imodium as directed and to use the metoclopramide and ondansetron as ordered.  Stated that she has taken the imodium and the diarrhea is better and will use the metoclopramide and the ondansetron.  Instructed to go to ED if nausea and diarrhea worsens.

## 2014-04-06 NOTE — Patient Instructions (Signed)
[facility] Discharge Instructions  RECOMMENDATIONS MADE BY THE CONSULTANT AND ANY TEST RESULTS WILL BE SENT TO YOUR REFERRING PHYSICIAN.  EXAM FINDINGS BY THE PHYSICIAN TODAY AND SIGNS OR SYMPTOMS TO REPORT TO CLINIC OR PRIMARY PHYSICIAN: Exam and findings as discussed by Dr. Barnet Glasgow.  Will give you some fluids today along with and intravenous antibiotic.  Report fevers, uncontrolled nausea, vomiting, diarrhea or pain.  Increase fluid intake.  MEDICATIONS PRESCRIBED:  Azithromycin- take as prescribed Claritin D - take as prescribed  INSTRUCTIONS/FOLLOW-UP: Follow-up in 1 week with labs and office visit.  Thank you for choosing [facility] to provide your oncology and hematology care.  To afford each patient quality time with our providers, please arrive at least 15 minutes before your scheduled appointment time.  With your help, our goal is to use those 15 minutes to complete the necessary work-up to ensure our physicians have the information they need to help with your evaluation and healthcare recommendations.    Effective January 1st, 2014, we ask that you re-schedule your appointment with our physicians should you arrive 10 or more minutes late for your appointment.  We strive to give you quality time with our providers, and arriving late affects you and other patients whose appointments are after yours.    Again, thank you for choosing [facility].  Our hope is that these requests will decrease the amount of time that you wait before being seen by our physicians.       _____________________________________________________________  Should you have questions after your visit to [facility], please contact our office at [phone] between the hours of 8:30 a.m. and 5:00 p.m.  Voicemails left after 4:30 p.m. will not be returned until the following business day.  For prescription refill requests, have your pharmacy contact our  office with your prescription refill request.

## 2014-04-06 NOTE — Progress Notes (Signed)
[facility]  OFFICE PROGRESS NOTE  [name], MD [address] [place] [address]  DIAGNOSIS: Esophageal cancer - Plan: CBC with Differential  Chief Complaint  Patient presents with  . Esophageal cancer stage IV    CURRENT THERAPY: FOLFOX cycle 1 given one week ago starting on 03/29/2014.  INTERVAL HISTORY: Elizabeth Moss 58 y.o. 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  Over the past 3 days the patient has had severe nasal congestion for which she took an oral Zycam.. She's also had diarrhea. She still has intermittent regurgitation of food but not after each meal. She is generally weak with no hematochezia, melena, or hematemesis. She denies any earache but does have a sore throat. 2 doors at home are sick. She denies any fever or night sweats, abdominal distention, but with cough without expectoration. She denies any dysuria or incontinence. She also denies any headache. She also denies any peripheral paresthesias and avoided cold exposure after receiving oxaliplatin.  MEDICAL HISTORY: Past Medical History  Diagnosis Date  . Hyperlipidemia   . Hypothyroidism   . Tobacco abuse   . Esophageal cancer   . Weakness 03/25/2014    INTERIM HISTORY: has Aortic ectasia, thoracic; Thoracic aortic ectasia; Right upper quadrant pain; CAP (community acquired pneumonia); Nausea & vomiting; Adrenal mass, right; Tobacco abuse; Poor dentition; Metabolic alkalosis; Hypothyroidism; Macrocytosis; Dysphagia; Protein-calorie malnutrition, severe; Esophageal cancer; Hx of fall; and Weakness on her problem list.   FOLFOX started 03/29/2014 ALLERGIES:  is allergic to iohexol and tetracyclines & related.  MEDICATIONS: has a current medication list which includes the following prescription(s): atorvastatin, levothyroxine,  lidocaine-prilocaine, metoclopramide, ondansetron, and pseudoephedrine-ibuprofen.  SURGICAL HISTORY:  Past Surgical History  Procedure Laterality Date  . Abdominal hysterectomy  2000  . Cholecystectomy  1986  . Mandible surgery      lower and upper  . Cesarean section    . Dilation and curettage of uterus    . Esophagogastroduodenoscopy (egd) with propofol N/A 03/01/2014    Procedure: ESOPHAGOGASTRODUODENOSCOPY (EGD) WITH PROPOFOL;  Surgeon: Wonda Horner, MD;  Location: [facility] ENDOSCOPY;  Service: Endoscopy;  Laterality: N/A;    FAMILY HISTORY: family history includes Hyperlipidemia in her mother; Other in her mother.  SOCIAL HISTORY:  reports that she has been smoking Cigarettes.  She has a 17.5 pack-year smoking history. She has never used smokeless tobacco. She reports that she does not drink alcohol or use illicit drugs.  REVIEW OF SYSTEMS:  Other than that discussed above is noncontributory.  PHYSICAL EXAMINATION: ECOG PERFORMANCE STATUS: 2 - Symptomatic, <50% confined to bed  There were no vitals taken for this visit.  GENERAL:alert, no distress and comfortable SKIN: skin color, texture, turgor are normal, no rashes or significant lesions EYES: PERLA; Conjunctiva are pink and non-injected, sclera clear SINUSES: Tenderness over both maxillary and ethmoid sinuses with significant rhinorrhea. OROPHARYNX:no exudate, no erythema on lips, buccal mucosa, or tongue. NECK: supple, thyroid normal size, non-tender, without nodularity. No masses CHEST: Increased AP diameter with no breast masses. LYMPH:  no palpable lymphadenopathy in the cervical, axillary or inguinal LUNGS: clear to auscultation and percussion with normal breathing effort HEART: regular rate & rhythm and no murmurs. ABDOMEN:abdomen soft, non-tender and normal bowel sounds. No free fluid wave or shifting dullness. MUSCULOSKELETAL:no cyanosis of digits and no clubbing. Range of motion normal.  NEURO: alert & oriented x 3  with fluent speech, no focal motor/sensory deficits   LABORATORY DATA: Hospital Outpatient Visit on 03/24/2014  Component Date Value Ref Range Status  . WBC 03/24/2014 6.4  4.0 - 10.5 K/uL Final  . RBC 03/24/2014 4.34  3.87 - 5.11 MIL/uL Final  . Hemoglobin 03/24/2014 15.1* 12.0 - 15.0 g/dL Final  . HCT 03/24/2014 45.4  36.0 - 46.0 % Final  . MCV 03/24/2014 104.6* 78.0 - 100.0 fL Final  . MCH 03/24/2014 34.8* 26.0 - 34.0 pg Final  . MCHC 03/24/2014 33.3  30.0 - 36.0 g/dL Final  . RDW 03/24/2014 14.7  11.5 - 15.5 % Final  . Platelets 03/24/2014 197  150 - 400 K/uL Final  . Neutrophils Relative % 03/24/2014 67  43 - 77 % Final  . Neutro Abs 03/24/2014 4.3  1.7 - 7.7 K/uL Final  . Lymphocytes Relative 03/24/2014 20  12 - 46 % Final  . Lymphs Abs 03/24/2014 1.3  0.7 - 4.0 K/uL Final  . Monocytes Relative 03/24/2014 9  3 - 12 % Final  . Monocytes Absolute 03/24/2014 0.6  0.1 - 1.0 K/uL Final  . Eosinophils Relative 03/24/2014 4  0 - 5 % Final  . Eosinophils Absolute 03/24/2014 0.2  0.0 - 0.7 K/uL Final  . Basophils Relative 03/24/2014 0  0 - 1 % Final  . Basophils Absolute 03/24/2014 0.0  0.0 - 0.1 K/uL Final  . Prothrombin Time 03/24/2014 12.3  11.6 - 15.2 seconds Final  . INR 03/24/2014 0.93  0.00 - 1.49 Final  Office Visit on 03/17/2014  Component Date Value Ref Range Status  . WBC 03/17/2014 7.5  4.0 - 10.5 K/uL Final  . RBC 03/17/2014 4.30  3.87 - 5.11 MIL/uL Final  . Hemoglobin 03/17/2014 15.2* 12.0 - 15.0 g/dL Final  . HCT 03/17/2014 46.4* 36.0 - 46.0 % Final  . MCV 03/17/2014 107.9* 78.0 - 100.0 fL Final  . MCH 03/17/2014 35.3* 26.0 - 34.0 pg Final  . MCHC 03/17/2014 32.8  30.0 - 36.0 g/dL Final  . RDW 03/17/2014 15.1  11.5 - 15.5 % Final  . Platelets 03/17/2014 226  150 - 400 K/uL Final  . Neutrophils Relative % 03/17/2014 73  43 - 77 % Final  . Neutro Abs 03/17/2014 5.4  1.7 - 7.7 K/uL Final  . Lymphocytes Relative 03/17/2014 17  12 - 46 % Final  . Lymphs Abs 03/17/2014  1.3  0.7 - 4.0 K/uL Final  . Monocytes Relative 03/17/2014 7  3 - 12 % Final  . Monocytes Absolute 03/17/2014 0.6  0.1 - 1.0 K/uL Final  . Eosinophils Relative 03/17/2014 3  0 - 5 % Final  . Eosinophils Absolute 03/17/2014 0.2  0.0 - 0.7 K/uL Final  . Basophils Relative 03/17/2014 0  0 - 1 % Final  . Basophils Absolute 03/17/2014 0.0  0.0 - 0.1 K/uL Final  . Sodium 03/17/2014 140  137 - 147 mEq/L Final  . Potassium 03/17/2014 4.2  3.7 - 5.3 mEq/L Final  . Chloride 03/17/2014 99  96 - 112 mEq/L Final  . CO2 03/17/2014 32  19 - 32 mEq/L Final  . Glucose, Bld 03/17/2014 113* 70 - 99 mg/dL Final  . BUN 03/17/2014 7  6 - 23 mg/dL Final  . Creatinine, Ser 03/17/2014 0.73  0.50 - 1.10 mg/dL Final  . Calcium 03/17/2014 10.1  8.4 - 10.5 mg/dL Final  . Total Protein 03/17/2014 6.8  6.0 - 8.3 g/dL Final  .  Albumin 03/17/2014 2.8* 3.5 - 5.2 g/dL Final  . AST 03/17/2014 20  0 - 37 U/L Final  . ALT 03/17/2014 12  0 - 35 U/L Final  . Alkaline Phosphatase 03/17/2014 125* 39 - 117 U/L Final  . Total Bilirubin 03/17/2014 0.6  0.3 - 1.2 mg/dL Final  . GFR calc non Af Amer 03/17/2014 >90  >90 mL/min Final  . GFR calc Af Amer 03/17/2014 >90  >90 mL/min Final   Comment: (NOTE)                          The eGFR has been calculated using the CKD EPI equation.                          This calculation has not been validated in all clinical situations.                          eGFR's persistently <90 mL/min signify possible Chronic Kidney                          Disease.  . CEA 03/17/2014 27.1* 0.0 - 5.0 ng/mL Final   Performed at [facility]  . CA 19-9 03/17/2014 20.5* <35.0 U/mL Final   Performed at [facility]  . Ferritin 03/17/2014 125  10 - 291 ng/mL Final   Performed at [facility] Outpatient Visit on 03/15/2014  Component Date Value Ref Range Status  . Glucose-Capillary 03/15/2014 81  70 - 99 mg/dL Final    PATHOLOGY: No new pathology. Adenocarcinoma was  HER-2 non-overexpressed  Urinalysis    Component Value Date/Time   COLORURINE YELLOW 02/01/2014 0354   APPEARANCEUR CLEAR 02/01/2014 0354   LABSPEC 1.010 02/01/2014 0354   PHURINE 7.0 02/01/2014 0354   GLUCOSEU NEGATIVE 02/01/2014 0354   HGBUR NEGATIVE 02/01/2014 0354   BILIRUBINUR NEGATIVE 02/01/2014 0354   KETONESUR NEGATIVE 02/01/2014 0354   PROTEINUR NEGATIVE 02/01/2014 0354   UROBILINOGEN 0.2 02/01/2014 0354   NITRITE NEGATIVE 02/01/2014 0354   LEUKOCYTESUR NEGATIVE 02/01/2014 0354    RADIOGRAPHIC STUDIES: Nm Pet Image Initial (pi) Skull Base To Thigh  03/15/2014   CLINICAL DATA:  Initial treatment strategy for esophageal carcinoma.  EXAM: NUCLEAR MEDICINE PET SKULL BASE TO THIGH  TECHNIQUE: 7.7 mCi F-18 FDG was injected intravenously. Full-ring PET imaging was performed from the skull base to thigh after the radiotracer. CT data was obtained and used for attenuation correction and anatomic localization.  FASTING BLOOD GLUCOSE:  Value: 81 mg/dl  COMPARISON:  CT CHEST W/O CM dated 02/27/2014; CT ABDOMEN W/O CM dated 02/26/2014  FINDINGS: NECK  No hypermetabolic lymph nodes in the neck. CT images show no acute findings.  CHEST  A 1.8 cm high right paratracheal lymph node has an SUV max of 8.2. Left subcarinal lymph node measures 1.4 cm with an SUV max of 10.2.  Focal hypermetabolism is seen in the upper esophagus (CT image 48 and PET image 49). Mid esophageal mass measures 3.0 x 3.8 cm with an SUV max of 29.1. Abnormal hypermetabolism extends inferiorly to just above the gastroesophageal junction. Hypermetabolic lymph nodes are seen along the lateral margin of the mid descending thoracic aorta (PET image 56).  A subpleural nodule in the medial aspect of the right upper lobe measures 1.0 x 1.4 cm with an SUV max of  10.4 (CT series 8, image 34 and PET image 57). An ovoid lesion in the lateral aspect of the inferior right lower lobe measures approximately 1.0 x 2.8 cm (CT series 8, image 49) with an SUV max of  3.9.  A soft tissue nodule in the left lateral paraspinous musculature measures 1.1 x 1.8 cm and has an SUV max of 7.1 (CT image 58 and PET image 59).  CT images show dilatation of the proximal esophagus, indicative of a component of obstruction. Heart size normal. No pericardial effusion. There is volume loss in the right lower lobe, adjacent to the right hemidiaphragm. Scattered pulmonary parenchymal scarring. 5 mm right upper lobe nodule (series 8, image 26), as on 02/27/2014. 6 mm left lower lobe nodule (series 8, image 47), stable. The sub cm pulmonary nodules are too small for PET resolution. No pleural fluid.  ABDOMEN/PELVIS  No abnormal hypermetabolism in the liver. Right adrenal nodule measures 1.7 x 2.3 cm with an SUV max of 9.7. Gastrohepatic ligament lymph nodes measure up to 13 mm in short axis with an SUV max of 19.4 (CT image 85 and PET image 84).  No abnormal hypermetabolism within the spleen or pancreas. No additional hypermetabolic lymph nodes.  There is focal hypermetabolism within the musculature between the right L1 and L2 transverse processes (CT image 102) without a definite CT correlate. A soft tissue nodule in the left gluteus musculature measures 15 mm with an SUV max of 7.8 (CT and PET image 171). No additional areas of abnormal hypermetabolism in the abdomen or pelvis.  CT images show the liver to be grossly unremarkable. Cholecystectomy. Left adrenal gland and right kidney are unremarkable. 1.3 cm low-attenuation exophytic lesion off the left kidney is unchanged. Spleen, pancreas, stomach and bowel are grossly unremarkable. Hysterectomy. Ovaries are visualized. No free fluid.  SKELETON  No hypermetabolic osseous lesions.  IMPRESSION: 1. Primary esophageal carcinoma with metastatic disease involving mediastinal, subcarinal, descending periaortic and gastrohepatic ligament lymph nodes. Additional metastatic disease is seen in the right upper lobe, right adrenal gland, left paraspinal  musculature and left gluteus musculature. 2. Mildly hypermetabolic ovoid subpleural opacity in the right lower lobe, indeterminate. Continued attention on followup exams is warranted. 3. Additional scattered pulmonary nodules are too small for PET resolution.   Electronically Signed   By: Lorin Picket M.D.   On: 03/15/2014 17:02   Ir Fluoro Guide Cv Line Right  03/24/2014   CLINICAL DATA:  Esophageal cancer  EXAM: TUNNEL POWER PORT PLACEMENT WITH SUBCUTANEOUS POCKET UTILIZING ULTRASOUND & FLOUROSCOPY  FLUOROSCOPY TIME:  12 seconds.  MEDICATIONS AND MEDICAL HISTORY: Versed 2 mg, Fentanyl 50 mcg.  As antibiotic prophylaxis, Ancef was ordered pre-procedure and administered intravenously within one hour of incision.  ANESTHESIA/SEDATION: Moderate sedation time: 25 minutes  CONTRAST:  None  PROCEDURE: After written informed consent was obtained, patient was placed in the supine position on angiographic table. The right neck and chest was prepped and draped in a sterile fashion. Lidocaine was utilized for local anesthesia. The right internal jugular vein was noted to be patent initially with ultrasound. Under sonographic guidance, a micropuncture needle was inserted into the right IJ vein (Ultrasound and fluoroscopic image documentation was performed). The needle was removed over an 018 wire which was exchanged for a Amplatz. This was advanced into the IVC. An 8-French dilator was advanced over the Amplatz.  A small incision was made in the right upper chest over the anterior right second rib. Utilizing blunt dissection, a subcutaneous  pocket was created in the caudal direction. The pocket was irrigated with a copious amount of sterile normal saline. The port catheter was tunneled from the chest incision, and out the neck incision. The reservoir was inserted into the subcutaneous pocket and secured with two 3-0 Ethilon stitches. A peel-away sheath was advanced over the Amplatz wire. The port catheter was cut to  measure length and inserted through the peel-away sheath. The peel-away sheath was removed. The chest incision was closed with 3-0 Vicryl interrupted stitches for the subcutaneous tissue and a running of 4-0 Vicryl subcuticular stitch for the skin. The neck incision was closed with a 4-0 Vicryl subcuticular stitch. Derma-bond was applied to both surgical incisions. The port reservoir was flushed and instilled with heparinized saline. No complications.  FINDINGS: A right IJ vein Port-A-Cath is in place with its tip at the cavoatrial junction.  IMPRESSION: Successful 8 French right internal jugular vein power port placement with its tip at the SVC/RA junction.   Electronically Signed   By: Maryclare Bean M.D.   On: 03/24/2014 14:08   Ir US Guide Vasc Access Right  03/24/2014   CLINICAL DATA:  Esophageal cancer  EXAM: TUNNEL POWER PORT PLACEMENT WITH SUBCUTANEOUS POCKET UTILIZING ULTRASOUND & FLOUROSCOPY  FLUOROSCOPY TIME:  12 seconds.  MEDICATIONS AND MEDICAL HISTORY: Versed 2 mg, Fentanyl 50 mcg.  As antibiotic prophylaxis, Ancef was ordered pre-procedure and administered intravenously within one hour of incision.  ANESTHESIA/SEDATION: Moderate sedation time: 25 minutes  CONTRAST:  None  PROCEDURE: After written informed consent was obtained, patient was placed in the supine position on angiographic table. The right neck and chest was prepped and draped in a sterile fashion. Lidocaine was utilized for local anesthesia. The right internal jugular vein was noted to be patent initially with ultrasound. Under sonographic guidance, a micropuncture needle was inserted into the right IJ vein (Ultrasound and fluoroscopic image documentation was performed). The needle was removed over an 018 wire which was exchanged for a Amplatz. This was advanced into the IVC. An 8-French dilator was advanced over the Amplatz.  A small incision was made in the right upper chest over the anterior right second rib. Utilizing blunt dissection, a  subcutaneous pocket was created in the caudal direction. The pocket was irrigated with a copious amount of sterile normal saline. The port catheter was tunneled from the chest incision, and out the neck incision. The reservoir was inserted into the subcutaneous pocket and secured with two 3-0 Ethilon stitches. A peel-away sheath was advanced over the Amplatz wire. The port catheter was cut to measure length and inserted through the peel-away sheath. The peel-away sheath was removed. The chest incision was closed with 3-0 Vicryl interrupted stitches for the subcutaneous tissue and a running of 4-0 Vicryl subcuticular stitch for the skin. The neck incision was closed with a 4-0 Vicryl subcuticular stitch. Derma-bond was applied to both surgical incisions. The port reservoir was flushed and instilled with heparinized saline. No complications.  FINDINGS: A right IJ vein Port-A-Cath is in place with its tip at the cavoatrial junction.  IMPRESSION: Successful 8 French right internal jugular vein power port placement with its tip at the SVC/RA junction.   Electronically Signed   By: Maryclare Bean M.D.   On: 03/24/2014 14:08    ASSESSMENT:  #1. Acute bacterial sinusitis. #2. Stage IV esophageal carcinoma, status post first cycle of FOLFOX one week ago. #3. Bilateral pulmonary nodules, too small to elucidate on PET scan. #4. Hypothyroidism, on treatment.  PLAN:  #1. Rocephin 2 g intravenously. #2. IV normal saline +20 mEq of KCl over 2 hours with Zofran 8 mg IV. #3. Rx written for Z-Pak plus Claritin-D 24. #4. Followup in one week for cycle 2 of chemotherapy if able to tolerate.   All questions were answered. The patient knows to call the clinic with any problems, questions or concerns. We can certainly see the patient much sooner if necessary.   I spent 40 minutes counseling the patient face to face. The total time spent in the appointment was 55 minutes.    Farrel Gobble, MD 04/06/2014 8:29  AM  DISCLAIMER:  This note was dictated with voice recognition software.  Similar sounding words can inadvertently be transcribed inaccurately and may not be corrected upon review.

## 2014-04-07 ENCOUNTER — Encounter (HOSPITAL_COMMUNITY): Payer: 59

## 2014-04-09 NOTE — Progress Notes (Addendum)
[name], MD [address] [place] [address]  Esophageal cancer  Hypokalemia - Plan: potassium chloride 20 MEQ/15ML (10%) liquid 60 mEq  CURRENT THERAPY: FOLFOX starting on 03/29/2014  INTERVAL HISTORY: Elizabeth Moss 58 y.o. 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  On FOLFOX systemic chemotherapy.     Esophageal cancer   02/27/2014 Imaging CT CAP- Distal esophageal masslike density measuring 3.7 cm. R paratracheal lymphadenopathy.  Shotty <1 cm lymph nodes in the gastrohepatic ligament and right supraclavicular region.  Small B/L pulm mets and 1.9 cm R adrenal met   03/01/2014 Initial Diagnosis Distal esophageal biopsy demonstrating adenocarcinoma, Her2 negative.   03/15/2014 Imaging PET- Primary esophageal carcinoma with metastatic disease involving mediastinal, subcarinal, periaortic and gastrohepatic ligament lymph nodes. Metastatic disease is seen in the right upper lobe, right adrenal gland, left paraspinal, etc   03/29/2014 -  Chemotherapy FOLFOX    I personally reviewed and went over laboratory results with the patient.  The results are noted within this dictation.  Her potassium was noted to be critically low and therefore I ordered 60 mEq of Kdur liquid which she took.   Additionally, her [place] was noted to be 0.8.  Therefore, we will treat as planned, and on day 3 she will receive Neulasta.    The patient was given education regarding Neulasta.  She was educated regarding the risks, benefits, alternatives, and side effects.   I personally reviewed and went over radiographic studies with the patient.  The results are noted within this dictation.    She reports that her sinusitis has resolved, however, she reports 1 week history of a non-productive cough.  She denies any fevers or chills.  I recommended OTC Delsym for the cough.  If ineffective, she is encouraged to  call us and I would try Hycodan due to her difficulty with pill swallowing.   She notes that her appetite has improved and she is able to eat more solid foods.  Additionally, she is able to keep these foods down.  She notes that her dysphagia has improved.    Otherwise, oncologically, she denies any complaints and ROS questioning is negative.   Past Medical History  Diagnosis Date  . Hyperlipidemia   . Hypothyroidism   . Tobacco abuse   . Esophageal cancer   . Weakness 03/25/2014    has Aortic ectasia, thoracic; Thoracic aortic ectasia; Right upper quadrant pain; CAP (community acquired pneumonia); Nausea & vomiting; Adrenal mass, right; Tobacco abuse; Poor dentition; Metabolic alkalosis; Hypothyroidism; Macrocytosis; Dysphagia; Protein-calorie malnutrition, severe; Esophageal cancer; Hx of fall; and Weakness on her problem list.     is allergic to iohexol and tetracyclines & related.  Ms. Maravilla does not currently have medications on file.  Past Surgical History  Procedure Laterality Date  . Abdominal hysterectomy  2000  . Cholecystectomy  1986  . Mandible surgery      lower and upper  . Cesarean section    . Dilation and curettage of uterus    . Esophagogastroduodenoscopy (egd) with propofol N/A 03/01/2014    Procedure: ESOPHAGOGASTRODUODENOSCOPY (EGD) WITH PROPOFOL;  Surgeon: Wonda Horner, MD;  Location: [facility];  Service: Endoscopy;  Laterality: N/A;    Denies any headaches, dizziness, double vision, fevers, chills, night sweats, nausea, vomiting, diarrhea, constipation, chest pain, heart palpitations, shortness of breath, blood in stool, black tarry stool, urinary pain, urinary  burning, urinary frequency, hematuria.   PHYSICAL EXAMINATION  ECOG PERFORMANCE STATUS: 2 - Symptomatic, <50% confined to bed  There were no vitals filed for this visit.  GENERAL:alert, well nourished, well developed, comfortable, cooperative and chronically ill appearing, poor cleanliness,  smelling of feline urine SKIN: skin color, texture, turgor are normal, no rashes or significant lesions HEAD: Normocephalic, No masses, lesions, tenderness or abnormalities EYES: normal, PERRLA, EOMI, Conjunctiva are pink and non-injected EARS: External ears normal OROPHARYNX:mucous membranes are moist  NECK: supple, trachea midline LYMPH:  not examined BREAST:not examined LUNGS: clear to auscultation  HEART: regular rate & rhythm ABDOMEN:abdomen soft, non-tender, obese and normal bowel sounds BACK: Back symmetric, no curvature. EXTREMITIES:less then 2 second capillary refill, no joint deformities, effusion, or inflammation, no skin discoloration, no cyanosis, positive findings:  Yellow, thick, dirty, and long fingernails.  NEURO: alert & oriented x 3 with fluent speech, no focal motor/sensory deficits, gait normal    LABORATORY DATA: CBC    Component Value Date/Time   WBC 2.3* 04/12/2014 0844   RBC 4.07 04/12/2014 0844   HGB 13.8 04/12/2014 0844   HCT 42.2 04/12/2014 0844   PLT 103* 04/12/2014 0844   MCV 103.7* 04/12/2014 0844   MCH 33.9 04/12/2014 0844   MCHC 32.7 04/12/2014 0844   RDW 14.4 04/12/2014 0844   LYMPHSABS 1.1 04/12/2014 0844   MONOABS 0.3 04/12/2014 0844   EOSABS 0.1 04/12/2014 0844   BASOSABS 0.0 04/12/2014 0844      Chemistry      Component Value Date/Time   NA 143 04/12/2014 0844   K 2.7* 04/12/2014 0844   CL 100 04/12/2014 0844   CO2 32 04/12/2014 0844   BUN 3* 04/12/2014 0844   CREATININE 0.64 04/12/2014 0844      Component Value Date/Time   CALCIUM 9.5 04/12/2014 0844   ALKPHOS 101 04/12/2014 0844   AST 29 04/12/2014 0844   ALT 26 04/12/2014 0844   BILITOT 0.7 04/12/2014 0844     Lab Results  Component Value Date   CEA 27.1* 03/17/2014     RADIOGRAPHIC STUDIES:  03/15/2014  CLINICAL DATA: Initial treatment strategy for esophageal carcinoma.  EXAM:  NUCLEAR MEDICINE PET SKULL BASE TO THIGH  TECHNIQUE:  7.7 mCi F-18 FDG was injected intravenously. Full-ring PET imaging  was  performed from the skull base to thigh after the radiotracer. CT  data was obtained and used for attenuation correction and anatomic  localization.  FASTING BLOOD GLUCOSE: Value: 81 mg/dl  COMPARISON: CT CHEST W/O CM dated 02/27/2014; CT ABDOMEN W/O CM  dated 02/26/2014  FINDINGS:  NECK  No hypermetabolic lymph nodes in the neck. CT images show no acute  findings.  CHEST  A 1.8 cm high right paratracheal lymph node has an SUV max of 8.2.  Left subcarinal lymph node measures 1.4 cm with an SUV max of 10.2.  Focal hypermetabolism is seen in the upper esophagus (CT image 48  and PET image 49). Mid esophageal mass measures 3.0 x 3.8 cm with an  SUV max of 29.1. Abnormal hypermetabolism extends inferiorly to just  above the gastroesophageal junction. Hypermetabolic lymph nodes are  seen along the lateral margin of the mid descending thoracic aorta  (PET image 56).  A subpleural nodule in the medial aspect of the right upper lobe  measures 1.0 x 1.4 cm with an SUV max of 10.4 (CT series 8, image 34  and PET image 57). An ovoid lesion in the lateral aspect of the  inferior right lower lobe measures approximately 1.0 x 2.8 cm (CT  series 8, image 49) with an SUV max of 3.9.  A soft tissue nodule in the left lateral paraspinous musculature  measures 1.1 x 1.8 cm and has an SUV max of 7.1 (CT image 58 and PET  image 59).  CT images show dilatation of the proximal esophagus, indicative of a  component of obstruction. Heart size normal. No pericardial  effusion. There is volume loss in the right lower lobe, adjacent to  the right hemidiaphragm. Scattered pulmonary parenchymal scarring. 5  mm right upper lobe nodule (series 8, image 26), as on 02/27/2014. 6  mm left lower lobe nodule (series 8, image 47), stable. The sub cm  pulmonary nodules are too small for PET resolution. No pleural  fluid.  ABDOMEN/PELVIS  No abnormal hypermetabolism in the liver. Right adrenal nodule  measures 1.7 x 2.3  cm with an SUV max of 9.7. Gastrohepatic ligament  lymph nodes measure up to 13 mm in short axis with an SUV max of  19.4 (CT image 85 and PET image 84).  No abnormal hypermetabolism within the spleen or pancreas. No  additional hypermetabolic lymph nodes.  There is focal hypermetabolism within the musculature between the  right L1 and L2 transverse processes (CT image 102) without a  definite CT correlate. A soft tissue nodule in the left gluteus  musculature measures 15 mm with an SUV max of 7.8 (CT and PET image  171). No additional areas of abnormal hypermetabolism in the abdomen  or pelvis.  CT images show the liver to be grossly unremarkable.  Cholecystectomy. Left adrenal gland and right kidney are  unremarkable. 1.3 cm low-attenuation exophytic lesion off the left  kidney is unchanged. Spleen, pancreas, stomach and bowel are grossly  unremarkable. Hysterectomy. Ovaries are visualized. No free fluid.  SKELETON  No hypermetabolic osseous lesions.  IMPRESSION:  1. Primary esophageal carcinoma with metastatic disease involving  mediastinal, subcarinal, descending periaortic and gastrohepatic  ligament lymph nodes. Additional metastatic disease is seen in the  right upper lobe, right adrenal gland, left paraspinal musculature  and left gluteus musculature.  2. Mildly hypermetabolic ovoid subpleural opacity in the right lower  lobe, indeterminate. Continued attention on followup exams is  warranted.  3. Additional scattered pulmonary nodules are too small for PET  resolution.  Electronically Signed  By: Lorin Picket M.D.  On: 03/15/2014 17:02    ASSESSMENT:  1. Stage IV esophageal carcinoma, Her2 negative.  On systemic chemotherapy with FOLFOX 2. Bilateral pulmonary nodules, too small to elucidate on PET scan. 3. Cough, recommend OTC Delsym 4. Unclean 5. Low ANC, Neulasta added to treatment plan.  Patient Active Problem List   Diagnosis Date Noted  . Hx of fall  03/25/2014  . Weakness 03/25/2014  . Esophageal cancer 03/03/2014  . Protein-calorie malnutrition, severe 03/02/2014  . Right upper quadrant pain 02/27/2014  . CAP (community acquired pneumonia) 02/27/2014  . Nausea & vomiting 02/27/2014  . Adrenal mass, right 02/27/2014  . Tobacco abuse 02/27/2014  . Poor dentition 02/27/2014  . Metabolic alkalosis 36/14/4315  . Hypothyroidism 02/27/2014  . Macrocytosis 02/27/2014  . Dysphagia 02/27/2014  . Thoracic aortic ectasia 11/03/2012  . Aortic ectasia, thoracic 10/13/2012     PLAN:  1. I personally reviewed and went over laboratory results with the patient.  The results are noted within this dictation. 2. I personally reviewed and went over radiographic studies with the patient.  The results are noted within  this dictation.   3. Pre-chemo labs: CBC diff, CMET 4. Kdur liquid 60 mEq today 5. Rx for Kdur Liquid 40 mEq daily. 6. Neulasta added to treatment plan.  Risks, benefits, alternatives, and side effects (including bone pain) discussed.   7. Recommended Delsym OTC at HS for cough 8. Pre-chemo labs: CBC diff, CMET, Mg 9. Return in 2 weeks for follow-up   THERAPY PLAN:  She will continue with chemotherapy as she reports clinical improvement since starting FOLFOX on 4/20.  Neulasta was added to her treatment plan today for day 3 of each cycle.  We will manage any toxicities as they arise.   All questions were answered. The patient knows to call the clinic with any problems, questions or concerns. We can certainly see the patient much sooner if necessary.  Patient and plan discussed with Dr. Farrel Gobble and he is in agreement with the aforementioned.   Manon Hilding [id number] 04/12/2014    Addendum: Patient requests a cough suppressant while receiving chemotherapy. I will order Hycodan now.   [facility]

## 2014-04-12 ENCOUNTER — Telehealth (HOSPITAL_COMMUNITY): Payer: Self-pay | Admitting: *Deleted

## 2014-04-12 ENCOUNTER — Encounter (HOSPITAL_COMMUNITY): Payer: Self-pay | Admitting: Oncology

## 2014-04-12 ENCOUNTER — Encounter (HOSPITAL_COMMUNITY): Payer: 59 | Attending: Hematology and Oncology

## 2014-04-12 ENCOUNTER — Encounter (HOSPITAL_BASED_OUTPATIENT_CLINIC_OR_DEPARTMENT_OTHER): Payer: 59 | Admitting: Oncology

## 2014-04-12 ENCOUNTER — Ambulatory Visit (HOSPITAL_COMMUNITY): Payer: 59

## 2014-04-12 ENCOUNTER — Other Ambulatory Visit (HOSPITAL_COMMUNITY): Payer: Self-pay | Admitting: Hematology and Oncology

## 2014-04-12 VITALS — BP 99/58 | HR 69 | Temp 97.7°F | Resp 20 | Wt 141.4 lb

## 2014-04-12 DIAGNOSIS — R059 Cough, unspecified: Secondary | ICD-10-CM

## 2014-04-12 DIAGNOSIS — E876 Hypokalemia: Secondary | ICD-10-CM | POA: Insufficient documentation

## 2014-04-12 DIAGNOSIS — R918 Other nonspecific abnormal finding of lung field: Secondary | ICD-10-CM

## 2014-04-12 DIAGNOSIS — R05 Cough: Secondary | ICD-10-CM

## 2014-04-12 DIAGNOSIS — Z5111 Encounter for antineoplastic chemotherapy: Secondary | ICD-10-CM

## 2014-04-12 DIAGNOSIS — C155 Malignant neoplasm of lower third of esophagus: Secondary | ICD-10-CM

## 2014-04-12 DIAGNOSIS — E43 Unspecified severe protein-calorie malnutrition: Secondary | ICD-10-CM

## 2014-04-12 DIAGNOSIS — C159 Malignant neoplasm of esophagus, unspecified: Secondary | ICD-10-CM

## 2014-04-12 DIAGNOSIS — D72819 Decreased white blood cell count, unspecified: Secondary | ICD-10-CM

## 2014-04-12 LAB — CBC WITH DIFFERENTIAL/PLATELET
BASOS PCT: 1 % (ref 0–1)
Basophils Absolute: 0 10*3/uL (ref 0.0–0.1)
EOS PCT: 4 % (ref 0–5)
Eosinophils Absolute: 0.1 10*3/uL (ref 0.0–0.7)
HEMATOCRIT: 42.2 % (ref 36.0–46.0)
Hemoglobin: 13.8 g/dL (ref 12.0–15.0)
Lymphocytes Relative: 49 % — ABNORMAL HIGH (ref 12–46)
Lymphs Abs: 1.1 10*3/uL (ref 0.7–4.0)
MCH: 33.9 pg (ref 26.0–34.0)
MCHC: 32.7 g/dL (ref 30.0–36.0)
MCV: 103.7 fL — ABNORMAL HIGH (ref 78.0–100.0)
MONO ABS: 0.3 10*3/uL (ref 0.1–1.0)
Monocytes Relative: 13 % — ABNORMAL HIGH (ref 3–12)
Neutro Abs: 0.8 10*3/uL — ABNORMAL LOW (ref 1.7–7.7)
Neutrophils Relative %: 33 % — ABNORMAL LOW (ref 43–77)
PLATELETS: 103 10*3/uL — AB (ref 150–400)
RBC: 4.07 MIL/uL (ref 3.87–5.11)
RDW: 14.4 % (ref 11.5–15.5)
WBC: 2.3 10*3/uL — ABNORMAL LOW (ref 4.0–10.5)

## 2014-04-12 LAB — COMPREHENSIVE METABOLIC PANEL
ALBUMIN: 2.6 g/dL — AB (ref 3.5–5.2)
ALT: 26 U/L (ref 0–35)
AST: 29 U/L (ref 0–37)
Alkaline Phosphatase: 101 U/L (ref 39–117)
BUN: 3 mg/dL — ABNORMAL LOW (ref 6–23)
CALCIUM: 9.5 mg/dL (ref 8.4–10.5)
CO2: 32 mEq/L (ref 19–32)
CREATININE: 0.64 mg/dL (ref 0.50–1.10)
Chloride: 100 mEq/L (ref 96–112)
GFR calc Af Amer: >90 mL/min (ref >90)
GFR calc non Af Amer: >90 mL/min (ref >90)
Glucose, Bld: 94 mg/dL (ref 70–99)
Potassium: 2.7 mEq/L — CL (ref 3.7–5.3)
SODIUM: 143 meq/L (ref 137–147)
Total Bilirubin: 0.7 mg/dL (ref 0.3–1.2)
Total Protein: 5.6 g/dL — ABNORMAL LOW (ref 6.0–8.3)

## 2014-04-12 MED ORDER — SODIUM CHLORIDE 0.9 % IJ SOLN
10.0000 mL | INTRAMUSCULAR | Status: DC | PRN
  Administered 2014-04-12: 10 mL

## 2014-04-12 MED ORDER — LEUCOVORIN CALCIUM INJECTION 350 MG
400.0000 mg/m2 | Freq: Once | INTRAMUSCULAR | Status: AC
Start: 2014-04-12 — End: 2014-04-12
  Administered 2014-04-12: 696 mg via INTRAVENOUS
  Filled 2014-04-12: qty 34.8

## 2014-04-12 MED ORDER — DEXTROSE 5 % IV SOLN
Freq: Once | INTRAVENOUS | Status: AC
  Administered 2014-04-12: 10:00:00 via INTRAVENOUS

## 2014-04-12 MED ORDER — HYDROCODONE-HOMATROPINE 5-1.5 MG/5ML PO SYRP
5.0000 mL | ORAL_SOLUTION | Freq: Once | ORAL | Status: AC
  Administered 2014-04-12: 5 mL via ORAL
  Filled 2014-04-12: qty 5

## 2014-04-12 MED ORDER — POTASSIUM CHLORIDE 40 MEQ/15ML (20%) PO LIQD: 40.0000 meq | Freq: Every day | ORAL | Status: AC

## 2014-04-12 MED ORDER — FLUOROURACIL CHEMO INJECTION 2.5 GM/50ML
400.0000 mg/m2 | Freq: Once | INTRAVENOUS | Status: AC
  Administered 2014-04-12: 700 mg via INTRAVENOUS
  Filled 2014-04-12: qty 14

## 2014-04-12 MED ORDER — SODIUM CHLORIDE 0.9 % IV SOLN
2400.0000 mg/m2 | INTRAVENOUS | Status: DC
  Administered 2014-04-12: 4200 mg via INTRAVENOUS
  Filled 2014-04-12: qty 84

## 2014-04-12 MED ORDER — OXALIPLATIN CHEMO INJECTION 100 MG/20ML
85.0000 mg/m2 | Freq: Once | INTRAVENOUS | Status: AC
  Administered 2014-04-12: 150 mg via INTRAVENOUS
  Filled 2014-04-12: qty 30

## 2014-04-12 MED ORDER — SODIUM CHLORIDE 0.9 % IV SOLN: 8.0000 mg | Freq: Once | INTRAVENOUS | Status: DC

## 2014-04-12 MED ORDER — ONDANSETRON HCL 40 MG/20ML IJ SOLN
Freq: Once | INTRAMUSCULAR | Status: AC
  Administered 2014-04-12: 8 mg via INTRAVENOUS
  Filled 2014-04-12: qty 4

## 2014-04-12 MED ORDER — DEXAMETHASONE SODIUM PHOSPHATE 10 MG/ML IJ SOLN: 10.0000 mg | Freq: Once | INTRAMUSCULAR | Status: DC

## 2014-04-12 MED ORDER — POTASSIUM CHLORIDE 20 MEQ/15ML (10%) PO LIQD
60.0000 meq | Freq: Once | ORAL | Status: AC
  Administered 2014-04-12: 60 meq via ORAL
  Filled 2014-04-12: qty 60

## 2014-04-12 NOTE — Addendum Note (Signed)
Addended by: Baird Cancer on: 04/12/2014 12:16 PM   Modules accepted: Orders

## 2014-04-12 NOTE — Progress Notes (Signed)
CRITICAL VALUE ALERT Critical value received:  Potassium 2.7 Date of notification:  04/12/14 Time of notification: 0955 Critical value read back:  yes Nurse who received alert:  T.Malin Sambrano,RN MD notified (1st page):  Kirby Crigler, PA verbal at [phone]

## 2014-04-12 NOTE — Patient Instructions (Signed)
[facility] Discharge Instructions  RECOMMENDATIONS MADE BY THE CONSULTANT AND ANY TEST RESULTS WILL BE SENT TO YOUR REFERRING PHYSICIAN.  MEDICATIONS PRESCRIBED:  Liquid potassium.  Take 15 mL (1 tablespoon) daily x 2 weeks.  We will re-check your potassium level when you return for chemotherapy.  Recommend Delsym (over-the-counter) for your cough.  Take as directed. We added an injection to your chemotherapy regimen called Neulasta.  This is to increase your White Blood Cell Count and help decrease your risk of infections.  This medication can cause "bone pain" and if you experience this discomfort, we recommend Ibuprofen, or Aleve.  If this is ineffective, please call the [facility] for further directions.  INSTRUCTIONS GIVEN AND DISCUSSED: None  SPECIAL INSTRUCTIONS/FOLLOW-UP: Please call the [facility] with any issues or complaints [phone]).  Thank you for choosing [facility] to provide your oncology and hematology care.  To afford each patient quality time with our providers, please arrive at least 15 minutes before your scheduled appointment time.  With your help, our goal is to use those 15 minutes to complete the necessary work-up to ensure our physicians have the information they need to help with your evaluation and healthcare recommendations.    Effective January 1st, 2014, we ask that you re-schedule your appointment with our physicians should you arrive 10 or more minutes late for your appointment.  We strive to give you quality time with our providers, and arriving late affects you and other patients whose appointments are after yours.    Again, thank you for choosing [facility].  Our hope is that these requests will decrease the amount of time that you wait before being seen by our physicians.       _____________________________________________________________  Should you have questions after your  visit to  [facility], please contact our office at [phone] between the hours of 8:30 a.m. and 5:00 p.m.  Voicemails left after 4:30 p.m. will not be returned until the following business day.  For prescription refill requests, have your pharmacy contact our office with your prescription refill request.

## 2014-04-14 ENCOUNTER — Encounter (HOSPITAL_COMMUNITY): Payer: 59

## 2014-04-21 ENCOUNTER — Encounter (HOSPITAL_COMMUNITY): Payer: 59

## 2014-04-26 ENCOUNTER — Inpatient Hospital Stay (HOSPITAL_COMMUNITY): Payer: 59

## 2014-04-26 ENCOUNTER — Ambulatory Visit (HOSPITAL_COMMUNITY): Payer: 59

## 2014-04-28 ENCOUNTER — Encounter (HOSPITAL_COMMUNITY): Payer: 59

## 2014-05-05 ENCOUNTER — Encounter (HOSPITAL_COMMUNITY): Payer: 59

## 2014-05-10 ENCOUNTER — Inpatient Hospital Stay (HOSPITAL_COMMUNITY): Payer: 59

## 2014-05-10 ENCOUNTER — Ambulatory Visit (HOSPITAL_COMMUNITY): Payer: 59 | Admitting: Oncology

## 2014-05-10 DEATH — deceased

## 2014-05-12 ENCOUNTER — Encounter (HOSPITAL_COMMUNITY): Payer: 59

## 2014-08-06 ENCOUNTER — Other Ambulatory Visit: Payer: Self-pay | Admitting: *Deleted
